# Patient Record
Sex: Female | Born: 1955 | Race: White | Hispanic: No | Smoking: Never smoker
Health system: Southern US, Community
[De-identification: ages and names within clinical notes are randomized; demographics above are authoritative.]

## PROBLEM LIST (undated history)

## (undated) DIAGNOSIS — F410 Panic disorder [episodic paroxysmal anxiety] without agoraphobia: Secondary | ICD-10-CM

## (undated) DIAGNOSIS — F4000 Agoraphobia, unspecified: Secondary | ICD-10-CM

## (undated) DIAGNOSIS — J189 Pneumonia, unspecified organism: Secondary | ICD-10-CM

## (undated) DIAGNOSIS — M1990 Unspecified osteoarthritis, unspecified site: Secondary | ICD-10-CM

## (undated) DIAGNOSIS — F319 Bipolar disorder, unspecified: Secondary | ICD-10-CM

## (undated) DIAGNOSIS — F32A Depression, unspecified: Secondary | ICD-10-CM

## (undated) DIAGNOSIS — S27309A Unspecified injury of lung, unspecified, initial encounter: Secondary | ICD-10-CM

## (undated) DIAGNOSIS — C801 Malignant (primary) neoplasm, unspecified: Secondary | ICD-10-CM

## (undated) DIAGNOSIS — F329 Major depressive disorder, single episode, unspecified: Secondary | ICD-10-CM

## (undated) DIAGNOSIS — F4310 Post-traumatic stress disorder, unspecified: Secondary | ICD-10-CM

## (undated) HISTORY — PX: COLONOSCOPY: SHX174

## (undated) HISTORY — PX: TOE SURGERY: SHX1073

## (undated) HISTORY — PX: REPLACEMENT TOTAL KNEE: SUR1224

## (undated) HISTORY — PX: ABDOMINAL HYSTERECTOMY: SHX81

## (undated) HISTORY — DX: Unspecified osteoarthritis, unspecified site: M19.90

---

## 1997-10-04 ENCOUNTER — Emergency Department (HOSPITAL_COMMUNITY): Admission: EM | Admit: 1997-10-04 | Discharge: 1997-10-04 | Payer: Self-pay | Admitting: Emergency Medicine

## 1997-10-06 ENCOUNTER — Emergency Department (HOSPITAL_COMMUNITY): Admission: EM | Admit: 1997-10-06 | Discharge: 1997-10-06 | Payer: Self-pay | Admitting: Emergency Medicine

## 1998-03-01 ENCOUNTER — Emergency Department (HOSPITAL_COMMUNITY): Admission: EM | Admit: 1998-03-01 | Discharge: 1998-03-01 | Payer: Self-pay | Admitting: Emergency Medicine

## 1998-04-15 ENCOUNTER — Ambulatory Visit (HOSPITAL_COMMUNITY): Admission: RE | Admit: 1998-04-15 | Discharge: 1998-04-15 | Payer: Self-pay | Admitting: Family Medicine

## 1998-04-15 ENCOUNTER — Encounter: Payer: Self-pay | Admitting: Family Medicine

## 1998-05-30 ENCOUNTER — Other Ambulatory Visit: Admission: RE | Admit: 1998-05-30 | Discharge: 1998-05-30 | Payer: Self-pay | Admitting: *Deleted

## 1999-11-10 ENCOUNTER — Other Ambulatory Visit: Admission: RE | Admit: 1999-11-10 | Discharge: 1999-11-10 | Payer: Self-pay | Admitting: *Deleted

## 1999-11-18 ENCOUNTER — Encounter: Payer: Self-pay | Admitting: *Deleted

## 1999-11-18 ENCOUNTER — Encounter: Admission: RE | Admit: 1999-11-18 | Discharge: 1999-11-18 | Payer: Self-pay | Admitting: *Deleted

## 2009-04-21 ENCOUNTER — Ambulatory Visit: Payer: Self-pay

## 2009-06-03 ENCOUNTER — Ambulatory Visit: Payer: Self-pay

## 2013-08-03 ENCOUNTER — Ambulatory Visit: Payer: Self-pay | Admitting: Gastroenterology

## 2014-10-21 ENCOUNTER — Other Ambulatory Visit: Payer: Self-pay

## 2014-10-21 ENCOUNTER — Emergency Department
Admission: EM | Admit: 2014-10-21 | Discharge: 2014-10-21 | Disposition: A | Discharge status: Home / Self Care | Payer: PPO | Attending: Emergency Medicine | Admitting: Emergency Medicine

## 2014-10-21 ENCOUNTER — Emergency Department: Payer: PPO

## 2014-10-21 ENCOUNTER — Encounter: Payer: Self-pay | Admitting: Emergency Medicine

## 2014-10-21 DIAGNOSIS — R200 Anesthesia of skin: Secondary | ICD-10-CM | POA: Diagnosis present

## 2014-10-21 DIAGNOSIS — R42 Dizziness and giddiness: Secondary | ICD-10-CM | POA: Diagnosis not present

## 2014-10-21 DIAGNOSIS — M542 Cervicalgia: Secondary | ICD-10-CM | POA: Insufficient documentation

## 2014-10-21 DIAGNOSIS — R51 Headache: Secondary | ICD-10-CM | POA: Insufficient documentation

## 2014-10-21 DIAGNOSIS — R0602 Shortness of breath: Secondary | ICD-10-CM | POA: Insufficient documentation

## 2014-10-21 HISTORY — DX: Post-traumatic stress disorder, unspecified: F43.10

## 2014-10-21 HISTORY — DX: Depression, unspecified: F32.A

## 2014-10-21 HISTORY — DX: Agoraphobia, unspecified: F40.00

## 2014-10-21 HISTORY — DX: Bipolar disorder, unspecified: F31.9

## 2014-10-21 HISTORY — DX: Panic disorder (episodic paroxysmal anxiety): F41.0

## 2014-10-21 HISTORY — DX: Major depressive disorder, single episode, unspecified: F32.9

## 2014-10-21 LAB — CBC
HCT: 40.9 % (ref 35.0–47.0)
Hemoglobin: 13.5 g/dL (ref 12.0–16.0)
MCH: 30.3 pg (ref 26.0–34.0)
MCHC: 33 g/dL (ref 32.0–36.0)
MCV: 91.9 fL (ref 80.0–100.0)
Platelets: 275 10*3/uL (ref 150–440)
RBC: 4.45 MIL/uL (ref 3.80–5.20)
RDW: 13.6 % (ref 11.5–14.5)
WBC: 7.6 10*3/uL (ref 3.6–11.0)

## 2014-10-21 LAB — BASIC METABOLIC PANEL
Anion gap: 9 (ref 5–15)
BUN: 16 mg/dL (ref 6–20)
CO2: 26 mmol/L (ref 22–32)
Calcium: 9.2 mg/dL (ref 8.9–10.3)
Chloride: 101 mmol/L (ref 101–111)
Creatinine, Ser: 0.98 mg/dL (ref 0.44–1.00)
GFR calc Af Amer: >60 mL/min (ref >60)
GFR calc non Af Amer: >60 mL/min (ref >60)
Glucose, Bld: 90 mg/dL (ref 65–99)
Potassium: 4.2 mmol/L (ref 3.5–5.1)
Sodium: 136 mmol/L (ref 135–145)

## 2014-10-21 NOTE — Discharge Instructions (Signed)

## 2014-10-21 NOTE — ED Notes (Signed)
Pt presents with right arm numbness, shortness of breath and right side neck pain started last night.

## 2014-10-21 NOTE — ED Provider Notes (Signed)
[facility] Emergency Department Provider Note     Time seen: ----------------------------------------- 4:17 PM on 10/21/2014 -----------------------------------------    I have reviewed the triage vital signs and the nursing notes.   HISTORY  Chief Complaint Numbness    HPI Mindy Caldwell is a 59 y.o. female who presents ER for right arm numbness, shortness of breath and right-sided neck pain that started last night.Patient states she has history of panic attacks, but it didn't feel like a panic attack. She states where she is working its been getting very hot, she felt flushed all over, a little dizzy and had somewhat of a headache. Patient was concerned that she may be having a stroke. Doesn't history of anxiety and bipolar depression, PTSD and Agoraphobia   Past Medical History  Diagnosis Date  . Bipolar 1 disorder   . Depression   . Panic attacks   . PTSD (post-traumatic stress disorder)   . Agoraphobia     There are no active problems to display for this patient.   Past Surgical History  Procedure Laterality Date  . Abdominal hysterectomy    . Toe surgery      Allergies Mobic  Social History History  Substance Use Topics  . Smoking status: Never Smoker   . Smokeless tobacco: Not on file  . Alcohol Use: No    Review of Systems Constitutional: Negative for fever. Eyes: Negative for visual changes. ENT: Negative for sore throat. Cardiovascular: Negative for chest pain. Respiratory: Negative for shortness of breath. Gastrointestinal: Negative for abdominal pain, vomiting and diarrhea. Genitourinary: Negative for dysuria. Musculoskeletal: Negative for back pain. Skin: Negative for rash. Neurological:  Positive for headache and dizziness, negative for weakness or numbness.  10-point ROS otherwise negative.  ____________________________________________   PHYSICAL EXAM:  VITAL SIGNS: ED Triage Vitals  Enc Vitals Group   BP 10/21/14 1339 105/82 mmHg     Pulse Rate 10/21/14 1339 72     Resp 10/21/14 1339 16     Temp 10/21/14 1339 98.2 F (36.8 C)     Temp Source 10/21/14 1339 Oral     SpO2 10/21/14 1339 97 %     Weight 10/21/14 1339 208 lb (94.348 kg)     Height 10/21/14 1339 5\' 11"  (1.803 m)     Head Cir --      Peak Flow --      Pain Score 10/21/14 1345 7     Pain Loc --      Pain Edu? --      Excl. in [place]? --     Constitutional: Alert and oriented. anxious, no acute distress : Conjunctivae are normal. PERRL. Normal extraocular movements. ENT   Head: Normocephalic and atraumatic.   Nose: No congestion/rhinnorhea.   Mouth/Throat: Mucous membranes are moist.   Neck: No stridor. Cardiovascular: Normal rate, regular rhythm. Normal and symmetric distal pulses are present in all extremities. No murmurs, rubs, or gallops. Respiratory: Normal respiratory effort without tachypnea nor retractions. Breath sounds are clear and equal bilaterally. No wheezes/rales/rhonchi. Gastrointestinal: Soft and nontender. No distention. No abdominal bruits. There is no CVA tenderness. Musculoskeletal: Nontender with normal range of motion in all extremities. No joint effusions.  No lower extremity tenderness nor edema. Neurologic:  Normal speech and language. No gross focal neurologic deficits are appreciated. Speech is normal. No gait instability. Skin:  Skin is warm, dry and intact. No rash noted. Psychiatric: Mood and affect are normal. Patient exhibits appropriate insight and judgment. ____________________________________________  EKG:  Interpreted by me. Normal sinus rhythm with short PR, PACs, rate is 68 bpm. Normal axis. No evidence of acute infarction.  ____________________________________________  ED COURSE:  Pertinent labs & imaging Patient will need basic lab work, neurologicresults that were available during my care of the patient were reviewed by me and considered in my medical decision making  (see chart for details). Patient need basic labs, reevaluation ____________________________________________    LABS (pertinent positives/negatives)  Labs Reviewed  BASIC METABOLIC PANEL  CBC    RADIOLOGY Images were viewed by me  CT head is unremarkable  ____________________________________________  FINAL ASSESSMENT AND PLAN  Dizziness  Plan: Patient with labs and imaging as dictated above. Symptoms are likely a combination of multiple issues including stress, poor sleep, poor diet and anxiety. Patient looks well, normal examination and normal workup here. She stable for outpatient follow-up with her doctor.   Earleen Newport, MD   Earleen Newport, MD 10/21/14 [phone]

## 2015-02-24 ENCOUNTER — Other Ambulatory Visit: Payer: Self-pay | Admitting: Unknown Physician Specialty

## 2015-02-24 DIAGNOSIS — M546 Pain in thoracic spine: Secondary | ICD-10-CM

## 2015-03-17 ENCOUNTER — Ambulatory Visit
Admission: RE | Admit: 2015-03-17 | Discharge: 2015-03-17 | Disposition: A | Discharge status: Home / Self Care | Payer: PPO | Source: Ambulatory Visit | Attending: Unknown Physician Specialty | Admitting: Unknown Physician Specialty

## 2015-03-17 DIAGNOSIS — M546 Pain in thoracic spine: Secondary | ICD-10-CM

## 2015-03-17 DIAGNOSIS — M47814 Spondylosis without myelopathy or radiculopathy, thoracic region: Secondary | ICD-10-CM | POA: Diagnosis not present

## 2015-03-31 DIAGNOSIS — M9902 Segmental and somatic dysfunction of thoracic region: Secondary | ICD-10-CM | POA: Diagnosis not present

## 2015-03-31 DIAGNOSIS — M9901 Segmental and somatic dysfunction of cervical region: Secondary | ICD-10-CM | POA: Diagnosis not present

## 2015-03-31 DIAGNOSIS — M50322 Other cervical disc degeneration at C5-C6 level: Secondary | ICD-10-CM | POA: Diagnosis not present

## 2015-03-31 DIAGNOSIS — M542 Cervicalgia: Secondary | ICD-10-CM | POA: Diagnosis not present

## 2015-04-02 DIAGNOSIS — M50322 Other cervical disc degeneration at C5-C6 level: Secondary | ICD-10-CM | POA: Diagnosis not present

## 2015-04-02 DIAGNOSIS — M9902 Segmental and somatic dysfunction of thoracic region: Secondary | ICD-10-CM | POA: Diagnosis not present

## 2015-04-02 DIAGNOSIS — M546 Pain in thoracic spine: Secondary | ICD-10-CM | POA: Diagnosis not present

## 2015-04-02 DIAGNOSIS — M5412 Radiculopathy, cervical region: Secondary | ICD-10-CM | POA: Diagnosis not present

## 2015-04-02 DIAGNOSIS — M9901 Segmental and somatic dysfunction of cervical region: Secondary | ICD-10-CM | POA: Diagnosis not present

## 2015-04-02 DIAGNOSIS — M542 Cervicalgia: Secondary | ICD-10-CM | POA: Diagnosis not present

## 2015-04-04 DIAGNOSIS — M9902 Segmental and somatic dysfunction of thoracic region: Secondary | ICD-10-CM | POA: Diagnosis not present

## 2015-04-04 DIAGNOSIS — M542 Cervicalgia: Secondary | ICD-10-CM | POA: Diagnosis not present

## 2015-04-04 DIAGNOSIS — M9901 Segmental and somatic dysfunction of cervical region: Secondary | ICD-10-CM | POA: Diagnosis not present

## 2015-04-04 DIAGNOSIS — M50322 Other cervical disc degeneration at C5-C6 level: Secondary | ICD-10-CM | POA: Diagnosis not present

## 2015-04-07 DIAGNOSIS — M9901 Segmental and somatic dysfunction of cervical region: Secondary | ICD-10-CM | POA: Diagnosis not present

## 2015-04-07 DIAGNOSIS — M542 Cervicalgia: Secondary | ICD-10-CM | POA: Diagnosis not present

## 2015-04-07 DIAGNOSIS — M50322 Other cervical disc degeneration at C5-C6 level: Secondary | ICD-10-CM | POA: Diagnosis not present

## 2015-04-07 DIAGNOSIS — M9902 Segmental and somatic dysfunction of thoracic region: Secondary | ICD-10-CM | POA: Diagnosis not present

## 2015-04-09 DIAGNOSIS — M9902 Segmental and somatic dysfunction of thoracic region: Secondary | ICD-10-CM | POA: Diagnosis not present

## 2015-04-09 DIAGNOSIS — Z0000 Encounter for general adult medical examination without abnormal findings: Secondary | ICD-10-CM | POA: Diagnosis not present

## 2015-04-09 DIAGNOSIS — Z1211 Encounter for screening for malignant neoplasm of colon: Secondary | ICD-10-CM | POA: Diagnosis not present

## 2015-04-09 DIAGNOSIS — M50322 Other cervical disc degeneration at C5-C6 level: Secondary | ICD-10-CM | POA: Diagnosis not present

## 2015-04-09 DIAGNOSIS — Z1272 Encounter for screening for malignant neoplasm of vagina: Secondary | ICD-10-CM | POA: Diagnosis not present

## 2015-04-09 DIAGNOSIS — Z1239 Encounter for other screening for malignant neoplasm of breast: Secondary | ICD-10-CM | POA: Diagnosis not present

## 2015-04-09 DIAGNOSIS — M9901 Segmental and somatic dysfunction of cervical region: Secondary | ICD-10-CM | POA: Diagnosis not present

## 2015-04-09 DIAGNOSIS — M542 Cervicalgia: Secondary | ICD-10-CM | POA: Diagnosis not present

## 2015-04-11 DIAGNOSIS — M542 Cervicalgia: Secondary | ICD-10-CM | POA: Diagnosis not present

## 2015-04-11 DIAGNOSIS — M9901 Segmental and somatic dysfunction of cervical region: Secondary | ICD-10-CM | POA: Diagnosis not present

## 2015-04-11 DIAGNOSIS — M9902 Segmental and somatic dysfunction of thoracic region: Secondary | ICD-10-CM | POA: Diagnosis not present

## 2015-04-11 DIAGNOSIS — M50322 Other cervical disc degeneration at C5-C6 level: Secondary | ICD-10-CM | POA: Diagnosis not present

## 2015-04-14 DIAGNOSIS — M542 Cervicalgia: Secondary | ICD-10-CM | POA: Diagnosis not present

## 2015-04-14 DIAGNOSIS — M9902 Segmental and somatic dysfunction of thoracic region: Secondary | ICD-10-CM | POA: Diagnosis not present

## 2015-04-14 DIAGNOSIS — M50322 Other cervical disc degeneration at C5-C6 level: Secondary | ICD-10-CM | POA: Diagnosis not present

## 2015-04-14 DIAGNOSIS — M9901 Segmental and somatic dysfunction of cervical region: Secondary | ICD-10-CM | POA: Diagnosis not present

## 2015-04-15 DIAGNOSIS — Z1231 Encounter for screening mammogram for malignant neoplasm of breast: Secondary | ICD-10-CM | POA: Diagnosis not present

## 2015-04-15 DIAGNOSIS — Z0000 Encounter for general adult medical examination without abnormal findings: Secondary | ICD-10-CM | POA: Diagnosis not present

## 2015-04-17 DIAGNOSIS — M9902 Segmental and somatic dysfunction of thoracic region: Secondary | ICD-10-CM | POA: Diagnosis not present

## 2015-04-17 DIAGNOSIS — M9901 Segmental and somatic dysfunction of cervical region: Secondary | ICD-10-CM | POA: Diagnosis not present

## 2015-04-17 DIAGNOSIS — M50322 Other cervical disc degeneration at C5-C6 level: Secondary | ICD-10-CM | POA: Diagnosis not present

## 2015-04-17 DIAGNOSIS — M542 Cervicalgia: Secondary | ICD-10-CM | POA: Diagnosis not present

## 2015-04-23 DIAGNOSIS — M9901 Segmental and somatic dysfunction of cervical region: Secondary | ICD-10-CM | POA: Diagnosis not present

## 2015-04-23 DIAGNOSIS — R799 Abnormal finding of blood chemistry, unspecified: Secondary | ICD-10-CM | POA: Diagnosis not present

## 2015-04-23 DIAGNOSIS — M9902 Segmental and somatic dysfunction of thoracic region: Secondary | ICD-10-CM | POA: Diagnosis not present

## 2015-04-23 DIAGNOSIS — M542 Cervicalgia: Secondary | ICD-10-CM | POA: Diagnosis not present

## 2015-04-23 DIAGNOSIS — M50322 Other cervical disc degeneration at C5-C6 level: Secondary | ICD-10-CM | POA: Diagnosis not present

## 2015-04-30 DIAGNOSIS — M542 Cervicalgia: Secondary | ICD-10-CM | POA: Diagnosis not present

## 2015-04-30 DIAGNOSIS — M9902 Segmental and somatic dysfunction of thoracic region: Secondary | ICD-10-CM | POA: Diagnosis not present

## 2015-04-30 DIAGNOSIS — M50322 Other cervical disc degeneration at C5-C6 level: Secondary | ICD-10-CM | POA: Diagnosis not present

## 2015-04-30 DIAGNOSIS — M9901 Segmental and somatic dysfunction of cervical region: Secondary | ICD-10-CM | POA: Diagnosis not present

## 2015-04-30 DIAGNOSIS — N289 Disorder of kidney and ureter, unspecified: Secondary | ICD-10-CM | POA: Diagnosis not present

## 2015-05-09 DIAGNOSIS — M1711 Unilateral primary osteoarthritis, right knee: Secondary | ICD-10-CM | POA: Diagnosis not present

## 2015-05-09 DIAGNOSIS — M7551 Bursitis of right shoulder: Secondary | ICD-10-CM | POA: Diagnosis not present

## 2015-05-09 DIAGNOSIS — M5412 Radiculopathy, cervical region: Secondary | ICD-10-CM | POA: Diagnosis not present

## 2015-05-22 DIAGNOSIS — M50322 Other cervical disc degeneration at C5-C6 level: Secondary | ICD-10-CM | POA: Diagnosis not present

## 2015-05-22 DIAGNOSIS — M9901 Segmental and somatic dysfunction of cervical region: Secondary | ICD-10-CM | POA: Diagnosis not present

## 2015-05-22 DIAGNOSIS — M542 Cervicalgia: Secondary | ICD-10-CM | POA: Diagnosis not present

## 2015-05-22 DIAGNOSIS — M9902 Segmental and somatic dysfunction of thoracic region: Secondary | ICD-10-CM | POA: Diagnosis not present

## 2015-05-28 DIAGNOSIS — Z1159 Encounter for screening for other viral diseases: Secondary | ICD-10-CM | POA: Diagnosis not present

## 2015-05-28 DIAGNOSIS — N289 Disorder of kidney and ureter, unspecified: Secondary | ICD-10-CM | POA: Diagnosis not present

## 2015-06-04 DIAGNOSIS — F4001 Agoraphobia with panic disorder: Secondary | ICD-10-CM | POA: Diagnosis not present

## 2015-06-04 DIAGNOSIS — F3181 Bipolar II disorder: Secondary | ICD-10-CM | POA: Diagnosis not present

## 2015-06-04 DIAGNOSIS — M50322 Other cervical disc degeneration at C5-C6 level: Secondary | ICD-10-CM | POA: Diagnosis not present

## 2015-06-04 DIAGNOSIS — M25511 Pain in right shoulder: Secondary | ICD-10-CM | POA: Diagnosis not present

## 2015-06-04 DIAGNOSIS — M9902 Segmental and somatic dysfunction of thoracic region: Secondary | ICD-10-CM | POA: Diagnosis not present

## 2015-06-04 DIAGNOSIS — M9901 Segmental and somatic dysfunction of cervical region: Secondary | ICD-10-CM | POA: Diagnosis not present

## 2015-06-05 DIAGNOSIS — Z79899 Other long term (current) drug therapy: Secondary | ICD-10-CM | POA: Diagnosis not present

## 2015-06-05 DIAGNOSIS — R768 Other specified abnormal immunological findings in serum: Secondary | ICD-10-CM | POA: Diagnosis not present

## 2015-06-23 DIAGNOSIS — F4001 Agoraphobia with panic disorder: Secondary | ICD-10-CM | POA: Diagnosis not present

## 2015-06-30 DIAGNOSIS — M50322 Other cervical disc degeneration at C5-C6 level: Secondary | ICD-10-CM | POA: Diagnosis not present

## 2015-06-30 DIAGNOSIS — M9902 Segmental and somatic dysfunction of thoracic region: Secondary | ICD-10-CM | POA: Diagnosis not present

## 2015-06-30 DIAGNOSIS — M25511 Pain in right shoulder: Secondary | ICD-10-CM | POA: Diagnosis not present

## 2015-06-30 DIAGNOSIS — M9901 Segmental and somatic dysfunction of cervical region: Secondary | ICD-10-CM | POA: Diagnosis not present

## 2015-07-08 DIAGNOSIS — F3181 Bipolar II disorder: Secondary | ICD-10-CM | POA: Diagnosis not present

## 2015-07-08 DIAGNOSIS — F4001 Agoraphobia with panic disorder: Secondary | ICD-10-CM | POA: Diagnosis not present

## 2015-07-17 DIAGNOSIS — F4001 Agoraphobia with panic disorder: Secondary | ICD-10-CM | POA: Diagnosis not present

## 2015-07-17 DIAGNOSIS — F3181 Bipolar II disorder: Secondary | ICD-10-CM | POA: Diagnosis not present

## 2015-07-23 DIAGNOSIS — F3181 Bipolar II disorder: Secondary | ICD-10-CM | POA: Diagnosis not present

## 2015-07-23 DIAGNOSIS — F4001 Agoraphobia with panic disorder: Secondary | ICD-10-CM | POA: Diagnosis not present

## 2015-08-06 DIAGNOSIS — F3181 Bipolar II disorder: Secondary | ICD-10-CM | POA: Diagnosis not present

## 2015-08-06 DIAGNOSIS — F4001 Agoraphobia with panic disorder: Secondary | ICD-10-CM | POA: Diagnosis not present

## 2015-08-13 DIAGNOSIS — F4001 Agoraphobia with panic disorder: Secondary | ICD-10-CM | POA: Diagnosis not present

## 2015-08-13 DIAGNOSIS — M50322 Other cervical disc degeneration at C5-C6 level: Secondary | ICD-10-CM | POA: Diagnosis not present

## 2015-08-13 DIAGNOSIS — M9902 Segmental and somatic dysfunction of thoracic region: Secondary | ICD-10-CM | POA: Diagnosis not present

## 2015-08-13 DIAGNOSIS — M9901 Segmental and somatic dysfunction of cervical region: Secondary | ICD-10-CM | POA: Diagnosis not present

## 2015-08-13 DIAGNOSIS — M25511 Pain in right shoulder: Secondary | ICD-10-CM | POA: Diagnosis not present

## 2015-08-13 DIAGNOSIS — F3181 Bipolar II disorder: Secondary | ICD-10-CM | POA: Diagnosis not present

## 2015-08-19 DIAGNOSIS — F4001 Agoraphobia with panic disorder: Secondary | ICD-10-CM | POA: Diagnosis not present

## 2015-08-19 DIAGNOSIS — M50322 Other cervical disc degeneration at C5-C6 level: Secondary | ICD-10-CM | POA: Diagnosis not present

## 2015-08-19 DIAGNOSIS — F3181 Bipolar II disorder: Secondary | ICD-10-CM | POA: Diagnosis not present

## 2015-08-19 DIAGNOSIS — M9902 Segmental and somatic dysfunction of thoracic region: Secondary | ICD-10-CM | POA: Diagnosis not present

## 2015-08-19 DIAGNOSIS — M25511 Pain in right shoulder: Secondary | ICD-10-CM | POA: Diagnosis not present

## 2015-08-19 DIAGNOSIS — M9901 Segmental and somatic dysfunction of cervical region: Secondary | ICD-10-CM | POA: Diagnosis not present

## 2015-10-02 DIAGNOSIS — F4001 Agoraphobia with panic disorder: Secondary | ICD-10-CM | POA: Diagnosis not present

## 2015-10-02 DIAGNOSIS — F3181 Bipolar II disorder: Secondary | ICD-10-CM | POA: Diagnosis not present

## 2015-10-09 DIAGNOSIS — F3181 Bipolar II disorder: Secondary | ICD-10-CM | POA: Diagnosis not present

## 2015-10-09 DIAGNOSIS — F4001 Agoraphobia with panic disorder: Secondary | ICD-10-CM | POA: Diagnosis not present

## 2015-11-05 DIAGNOSIS — M50322 Other cervical disc degeneration at C5-C6 level: Secondary | ICD-10-CM | POA: Diagnosis not present

## 2015-11-05 DIAGNOSIS — M9903 Segmental and somatic dysfunction of lumbar region: Secondary | ICD-10-CM | POA: Diagnosis not present

## 2015-11-05 DIAGNOSIS — M5136 Other intervertebral disc degeneration, lumbar region: Secondary | ICD-10-CM | POA: Diagnosis not present

## 2015-11-05 DIAGNOSIS — M9901 Segmental and somatic dysfunction of cervical region: Secondary | ICD-10-CM | POA: Diagnosis not present

## 2015-11-10 DIAGNOSIS — M9901 Segmental and somatic dysfunction of cervical region: Secondary | ICD-10-CM | POA: Diagnosis not present

## 2015-11-10 DIAGNOSIS — M50322 Other cervical disc degeneration at C5-C6 level: Secondary | ICD-10-CM | POA: Diagnosis not present

## 2015-11-10 DIAGNOSIS — M9903 Segmental and somatic dysfunction of lumbar region: Secondary | ICD-10-CM | POA: Diagnosis not present

## 2015-11-10 DIAGNOSIS — M5136 Other intervertebral disc degeneration, lumbar region: Secondary | ICD-10-CM | POA: Diagnosis not present

## 2015-11-12 DIAGNOSIS — M50322 Other cervical disc degeneration at C5-C6 level: Secondary | ICD-10-CM | POA: Diagnosis not present

## 2015-11-12 DIAGNOSIS — F4001 Agoraphobia with panic disorder: Secondary | ICD-10-CM | POA: Diagnosis not present

## 2015-11-12 DIAGNOSIS — M9901 Segmental and somatic dysfunction of cervical region: Secondary | ICD-10-CM | POA: Diagnosis not present

## 2015-11-12 DIAGNOSIS — M9903 Segmental and somatic dysfunction of lumbar region: Secondary | ICD-10-CM | POA: Diagnosis not present

## 2015-11-12 DIAGNOSIS — M5136 Other intervertebral disc degeneration, lumbar region: Secondary | ICD-10-CM | POA: Diagnosis not present

## 2015-11-12 DIAGNOSIS — F3181 Bipolar II disorder: Secondary | ICD-10-CM | POA: Diagnosis not present

## 2015-11-24 DIAGNOSIS — M25562 Pain in left knee: Secondary | ICD-10-CM | POA: Diagnosis not present

## 2015-11-24 DIAGNOSIS — M170 Bilateral primary osteoarthritis of knee: Secondary | ICD-10-CM | POA: Diagnosis not present

## 2015-11-24 DIAGNOSIS — M5412 Radiculopathy, cervical region: Secondary | ICD-10-CM | POA: Diagnosis not present

## 2015-12-05 DIAGNOSIS — M5136 Other intervertebral disc degeneration, lumbar region: Secondary | ICD-10-CM | POA: Diagnosis not present

## 2015-12-05 DIAGNOSIS — M9901 Segmental and somatic dysfunction of cervical region: Secondary | ICD-10-CM | POA: Diagnosis not present

## 2015-12-05 DIAGNOSIS — M9903 Segmental and somatic dysfunction of lumbar region: Secondary | ICD-10-CM | POA: Diagnosis not present

## 2015-12-05 DIAGNOSIS — M50322 Other cervical disc degeneration at C5-C6 level: Secondary | ICD-10-CM | POA: Diagnosis not present

## 2016-02-06 DIAGNOSIS — M9903 Segmental and somatic dysfunction of lumbar region: Secondary | ICD-10-CM | POA: Diagnosis not present

## 2016-02-06 DIAGNOSIS — F3181 Bipolar II disorder: Secondary | ICD-10-CM | POA: Diagnosis not present

## 2016-02-06 DIAGNOSIS — M50322 Other cervical disc degeneration at C5-C6 level: Secondary | ICD-10-CM | POA: Diagnosis not present

## 2016-02-06 DIAGNOSIS — F4001 Agoraphobia with panic disorder: Secondary | ICD-10-CM | POA: Diagnosis not present

## 2016-02-06 DIAGNOSIS — M5136 Other intervertebral disc degeneration, lumbar region: Secondary | ICD-10-CM | POA: Diagnosis not present

## 2016-02-06 DIAGNOSIS — M9901 Segmental and somatic dysfunction of cervical region: Secondary | ICD-10-CM | POA: Diagnosis not present

## 2016-02-17 DIAGNOSIS — M50322 Other cervical disc degeneration at C5-C6 level: Secondary | ICD-10-CM | POA: Diagnosis not present

## 2016-02-17 DIAGNOSIS — M9903 Segmental and somatic dysfunction of lumbar region: Secondary | ICD-10-CM | POA: Diagnosis not present

## 2016-02-17 DIAGNOSIS — M5136 Other intervertebral disc degeneration, lumbar region: Secondary | ICD-10-CM | POA: Diagnosis not present

## 2016-02-17 DIAGNOSIS — M9901 Segmental and somatic dysfunction of cervical region: Secondary | ICD-10-CM | POA: Diagnosis not present

## 2016-02-18 DIAGNOSIS — M9903 Segmental and somatic dysfunction of lumbar region: Secondary | ICD-10-CM | POA: Diagnosis not present

## 2016-02-18 DIAGNOSIS — M50322 Other cervical disc degeneration at C5-C6 level: Secondary | ICD-10-CM | POA: Diagnosis not present

## 2016-02-18 DIAGNOSIS — M5136 Other intervertebral disc degeneration, lumbar region: Secondary | ICD-10-CM | POA: Diagnosis not present

## 2016-02-18 DIAGNOSIS — M9901 Segmental and somatic dysfunction of cervical region: Secondary | ICD-10-CM | POA: Diagnosis not present

## 2016-02-23 DIAGNOSIS — M50322 Other cervical disc degeneration at C5-C6 level: Secondary | ICD-10-CM | POA: Diagnosis not present

## 2016-02-23 DIAGNOSIS — M9901 Segmental and somatic dysfunction of cervical region: Secondary | ICD-10-CM | POA: Diagnosis not present

## 2016-02-23 DIAGNOSIS — M5136 Other intervertebral disc degeneration, lumbar region: Secondary | ICD-10-CM | POA: Diagnosis not present

## 2016-02-23 DIAGNOSIS — M9903 Segmental and somatic dysfunction of lumbar region: Secondary | ICD-10-CM | POA: Diagnosis not present

## 2016-02-24 DIAGNOSIS — M50322 Other cervical disc degeneration at C5-C6 level: Secondary | ICD-10-CM | POA: Diagnosis not present

## 2016-02-24 DIAGNOSIS — M9903 Segmental and somatic dysfunction of lumbar region: Secondary | ICD-10-CM | POA: Diagnosis not present

## 2016-02-24 DIAGNOSIS — M5136 Other intervertebral disc degeneration, lumbar region: Secondary | ICD-10-CM | POA: Diagnosis not present

## 2016-02-24 DIAGNOSIS — M9901 Segmental and somatic dysfunction of cervical region: Secondary | ICD-10-CM | POA: Diagnosis not present

## 2016-02-25 DIAGNOSIS — M50322 Other cervical disc degeneration at C5-C6 level: Secondary | ICD-10-CM | POA: Diagnosis not present

## 2016-02-25 DIAGNOSIS — M5136 Other intervertebral disc degeneration, lumbar region: Secondary | ICD-10-CM | POA: Diagnosis not present

## 2016-02-25 DIAGNOSIS — M9903 Segmental and somatic dysfunction of lumbar region: Secondary | ICD-10-CM | POA: Diagnosis not present

## 2016-02-25 DIAGNOSIS — M9901 Segmental and somatic dysfunction of cervical region: Secondary | ICD-10-CM | POA: Diagnosis not present

## 2016-03-02 DIAGNOSIS — M5136 Other intervertebral disc degeneration, lumbar region: Secondary | ICD-10-CM | POA: Diagnosis not present

## 2016-03-02 DIAGNOSIS — M9901 Segmental and somatic dysfunction of cervical region: Secondary | ICD-10-CM | POA: Diagnosis not present

## 2016-03-02 DIAGNOSIS — M9903 Segmental and somatic dysfunction of lumbar region: Secondary | ICD-10-CM | POA: Diagnosis not present

## 2016-03-02 DIAGNOSIS — M50322 Other cervical disc degeneration at C5-C6 level: Secondary | ICD-10-CM | POA: Diagnosis not present

## 2016-03-04 DIAGNOSIS — S43401A Unspecified sprain of right shoulder joint, initial encounter: Secondary | ICD-10-CM | POA: Diagnosis not present

## 2016-03-04 DIAGNOSIS — M7551 Bursitis of right shoulder: Secondary | ICD-10-CM | POA: Diagnosis not present

## 2016-03-09 DIAGNOSIS — M9901 Segmental and somatic dysfunction of cervical region: Secondary | ICD-10-CM | POA: Diagnosis not present

## 2016-03-09 DIAGNOSIS — M50322 Other cervical disc degeneration at C5-C6 level: Secondary | ICD-10-CM | POA: Diagnosis not present

## 2016-03-09 DIAGNOSIS — M9903 Segmental and somatic dysfunction of lumbar region: Secondary | ICD-10-CM | POA: Diagnosis not present

## 2016-03-09 DIAGNOSIS — M5136 Other intervertebral disc degeneration, lumbar region: Secondary | ICD-10-CM | POA: Diagnosis not present

## 2016-04-07 DIAGNOSIS — R300 Dysuria: Secondary | ICD-10-CM | POA: Diagnosis not present

## 2016-04-07 DIAGNOSIS — N390 Urinary tract infection, site not specified: Secondary | ICD-10-CM | POA: Diagnosis not present

## 2016-04-07 DIAGNOSIS — J209 Acute bronchitis, unspecified: Secondary | ICD-10-CM | POA: Diagnosis not present

## 2016-04-07 DIAGNOSIS — R062 Wheezing: Secondary | ICD-10-CM | POA: Diagnosis not present

## 2016-04-19 DIAGNOSIS — N920 Excessive and frequent menstruation with regular cycle: Secondary | ICD-10-CM | POA: Diagnosis not present

## 2016-04-19 DIAGNOSIS — N941 Unspecified dyspareunia: Secondary | ICD-10-CM | POA: Diagnosis not present

## 2016-04-19 DIAGNOSIS — N895 Stricture and atresia of vagina: Secondary | ICD-10-CM | POA: Diagnosis not present

## 2016-05-06 DIAGNOSIS — R51 Headache: Secondary | ICD-10-CM | POA: Diagnosis not present

## 2016-05-06 DIAGNOSIS — R030 Elevated blood-pressure reading, without diagnosis of hypertension: Secondary | ICD-10-CM | POA: Diagnosis not present

## 2016-05-06 DIAGNOSIS — H538 Other visual disturbances: Secondary | ICD-10-CM | POA: Diagnosis not present

## 2016-05-07 DIAGNOSIS — F319 Bipolar disorder, unspecified: Secondary | ICD-10-CM | POA: Diagnosis not present

## 2016-05-07 DIAGNOSIS — R0602 Shortness of breath: Secondary | ICD-10-CM | POA: Diagnosis not present

## 2016-05-07 DIAGNOSIS — Z79899 Other long term (current) drug therapy: Secondary | ICD-10-CM | POA: Diagnosis not present

## 2016-05-07 DIAGNOSIS — R4781 Slurred speech: Secondary | ICD-10-CM | POA: Diagnosis not present

## 2016-05-07 DIAGNOSIS — R200 Anesthesia of skin: Secondary | ICD-10-CM | POA: Diagnosis not present

## 2016-05-07 DIAGNOSIS — M542 Cervicalgia: Secondary | ICD-10-CM | POA: Diagnosis not present

## 2016-05-07 DIAGNOSIS — R51 Headache: Secondary | ICD-10-CM | POA: Diagnosis not present

## 2016-05-07 DIAGNOSIS — R269 Unspecified abnormalities of gait and mobility: Secondary | ICD-10-CM | POA: Diagnosis not present

## 2016-05-07 DIAGNOSIS — Z7982 Long term (current) use of aspirin: Secondary | ICD-10-CM | POA: Diagnosis not present

## 2016-05-07 DIAGNOSIS — R42 Dizziness and giddiness: Secondary | ICD-10-CM | POA: Diagnosis not present

## 2016-05-07 DIAGNOSIS — F419 Anxiety disorder, unspecified: Secondary | ICD-10-CM | POA: Diagnosis not present

## 2016-05-10 DIAGNOSIS — M9901 Segmental and somatic dysfunction of cervical region: Secondary | ICD-10-CM | POA: Diagnosis not present

## 2016-05-10 DIAGNOSIS — M5031 Other cervical disc degeneration,  high cervical region: Secondary | ICD-10-CM | POA: Diagnosis not present

## 2016-05-11 DIAGNOSIS — M5031 Other cervical disc degeneration,  high cervical region: Secondary | ICD-10-CM | POA: Diagnosis not present

## 2016-05-11 DIAGNOSIS — M9901 Segmental and somatic dysfunction of cervical region: Secondary | ICD-10-CM | POA: Diagnosis not present

## 2016-06-09 DIAGNOSIS — R300 Dysuria: Secondary | ICD-10-CM | POA: Diagnosis not present

## 2016-06-09 DIAGNOSIS — J40 Bronchitis, not specified as acute or chronic: Secondary | ICD-10-CM | POA: Diagnosis not present

## 2016-06-09 DIAGNOSIS — T148XXA Other injury of unspecified body region, initial encounter: Secondary | ICD-10-CM | POA: Diagnosis not present

## 2016-06-09 DIAGNOSIS — R739 Hyperglycemia, unspecified: Secondary | ICD-10-CM | POA: Diagnosis not present

## 2016-06-09 DIAGNOSIS — E785 Hyperlipidemia, unspecified: Secondary | ICD-10-CM | POA: Diagnosis not present

## 2016-06-14 DIAGNOSIS — R739 Hyperglycemia, unspecified: Secondary | ICD-10-CM | POA: Diagnosis not present

## 2016-06-14 DIAGNOSIS — E785 Hyperlipidemia, unspecified: Secondary | ICD-10-CM | POA: Diagnosis not present

## 2016-06-14 DIAGNOSIS — R300 Dysuria: Secondary | ICD-10-CM | POA: Diagnosis not present

## 2016-06-14 DIAGNOSIS — S5011XA Contusion of right forearm, initial encounter: Secondary | ICD-10-CM | POA: Diagnosis not present

## 2016-06-22 DIAGNOSIS — T07XXXA Unspecified multiple injuries, initial encounter: Secondary | ICD-10-CM | POA: Diagnosis not present

## 2016-06-28 DIAGNOSIS — T07XXXA Unspecified multiple injuries, initial encounter: Secondary | ICD-10-CM | POA: Diagnosis not present

## 2016-06-30 DIAGNOSIS — M9903 Segmental and somatic dysfunction of lumbar region: Secondary | ICD-10-CM | POA: Diagnosis not present

## 2016-06-30 DIAGNOSIS — M9902 Segmental and somatic dysfunction of thoracic region: Secondary | ICD-10-CM | POA: Diagnosis not present

## 2016-06-30 DIAGNOSIS — M5104 Intervertebral disc disorders with myelopathy, thoracic region: Secondary | ICD-10-CM | POA: Diagnosis not present

## 2016-06-30 DIAGNOSIS — M5136 Other intervertebral disc degeneration, lumbar region: Secondary | ICD-10-CM | POA: Diagnosis not present

## 2016-07-12 ENCOUNTER — Ambulatory Visit: Payer: PPO | Admitting: Oncology

## 2016-07-19 ENCOUNTER — Inpatient Hospital Stay: Payer: PPO | Attending: Oncology | Admitting: Oncology

## 2016-07-19 ENCOUNTER — Encounter: Payer: Self-pay | Admitting: Oncology

## 2016-07-19 ENCOUNTER — Inpatient Hospital Stay: Payer: PPO

## 2016-07-19 VITALS — BP 134/84 | HR 89 | Temp 97.0°F | Resp 18 | Ht 71.06 in | Wt 190.6 lb

## 2016-07-19 DIAGNOSIS — R233 Spontaneous ecchymoses: Secondary | ICD-10-CM

## 2016-07-19 DIAGNOSIS — R5383 Other fatigue: Secondary | ICD-10-CM | POA: Diagnosis not present

## 2016-07-19 DIAGNOSIS — F319 Bipolar disorder, unspecified: Secondary | ICD-10-CM | POA: Insufficient documentation

## 2016-07-19 DIAGNOSIS — D721 Eosinophilia, unspecified: Secondary | ICD-10-CM

## 2016-07-19 DIAGNOSIS — Z808 Family history of malignant neoplasm of other organs or systems: Secondary | ICD-10-CM | POA: Insufficient documentation

## 2016-07-19 DIAGNOSIS — I10 Essential (primary) hypertension: Secondary | ICD-10-CM | POA: Insufficient documentation

## 2016-07-19 DIAGNOSIS — R531 Weakness: Secondary | ICD-10-CM | POA: Insufficient documentation

## 2016-07-19 DIAGNOSIS — R7989 Other specified abnormal findings of blood chemistry: Secondary | ICD-10-CM | POA: Diagnosis not present

## 2016-07-19 DIAGNOSIS — F4310 Post-traumatic stress disorder, unspecified: Secondary | ICD-10-CM | POA: Insufficient documentation

## 2016-07-19 DIAGNOSIS — M129 Arthropathy, unspecified: Secondary | ICD-10-CM | POA: Insufficient documentation

## 2016-07-19 DIAGNOSIS — Z801 Family history of malignant neoplasm of trachea, bronchus and lung: Secondary | ICD-10-CM | POA: Diagnosis not present

## 2016-07-19 DIAGNOSIS — Z79899 Other long term (current) drug therapy: Secondary | ICD-10-CM | POA: Diagnosis not present

## 2016-07-19 DIAGNOSIS — F4000 Agoraphobia, unspecified: Secondary | ICD-10-CM | POA: Diagnosis not present

## 2016-07-19 DIAGNOSIS — G43909 Migraine, unspecified, not intractable, without status migrainosus: Secondary | ICD-10-CM | POA: Insufficient documentation

## 2016-07-19 DIAGNOSIS — R238 Other skin changes: Secondary | ICD-10-CM

## 2016-07-19 LAB — COMPREHENSIVE METABOLIC PANEL
ALT: 21 U/L (ref 14–54)
AST: 30 U/L (ref 15–41)
Albumin: 4.8 g/dL (ref 3.5–5.0)
Alkaline Phosphatase: 66 U/L (ref 38–126)
Anion gap: 8 (ref 5–15)
BILIRUBIN TOTAL: 0.7 mg/dL (ref 0.3–1.2)
BUN: 14 mg/dL (ref 6–20)
CALCIUM: 9.7 mg/dL (ref 8.9–10.3)
CO2: 28 mmol/L (ref 22–32)
Chloride: 102 mmol/L (ref 101–111)
Creatinine, Ser: 0.89 mg/dL (ref 0.44–1.00)
GFR calc Af Amer: >60 mL/min (ref >60)
GLUCOSE: 100 mg/dL — AB (ref 65–99)
POTASSIUM: 3.8 mmol/L (ref 3.5–5.1)
Sodium: 138 mmol/L (ref 135–145)
Total Protein: 9.2 g/dL — ABNORMAL HIGH (ref 6.5–8.1)

## 2016-07-19 LAB — CBC
HCT: 45.3 % (ref 35.0–47.0)
Hemoglobin: 15.1 g/dL (ref 12.0–16.0)
MCH: 31.3 pg (ref 26.0–34.0)
MCHC: 33.3 g/dL (ref 32.0–36.0)
MCV: 93.8 fL (ref 80.0–100.0)
Platelets: 261 10*3/uL (ref 150–440)
RBC: 4.82 MIL/uL (ref 3.80–5.20)
RDW: 13.5 % (ref 11.5–14.5)
WBC: 8.3 10*3/uL (ref 3.6–11.0)

## 2016-07-19 LAB — URINALYSIS, COMPLETE (UACMP) WITH MICROSCOPIC
BILIRUBIN URINE: NEGATIVE
Glucose, UA: NEGATIVE mg/dL
HGB URINE DIPSTICK: NEGATIVE
KETONES UR: NEGATIVE mg/dL
NITRITE: NEGATIVE
PROTEIN: NEGATIVE mg/dL
Specific Gravity, Urine: 1.02 (ref 1.005–1.030)
pH: 6 (ref 5.0–8.0)

## 2016-07-19 LAB — DIFFERENTIAL
BASOS ABS: 0.1 10*3/uL (ref 0–0.1)
Basophils Relative: 1 %
EOS ABS: 0.4 10*3/uL (ref 0–0.7)
Eosinophils Relative: 5 %
LYMPHS ABS: 2.6 10*3/uL (ref 1.0–3.6)
LYMPHS PCT: 31 %
MONOS PCT: 8 %
Monocytes Absolute: 0.6 10*3/uL (ref 0.2–0.9)
Neutro Abs: 4.6 10*3/uL (ref 1.4–6.5)
Neutrophils Relative %: 55 %

## 2016-07-19 LAB — PLATELET FUNCTION ASSAY: Collagen / Epinephrine: 95 seconds (ref 0–193)

## 2016-07-19 LAB — PROTIME-INR
INR: 0.95
PROTHROMBIN TIME: 12.7 s (ref 11.4–15.2)

## 2016-07-19 LAB — APTT: aPTT: 29 seconds (ref 24–36)

## 2016-07-19 LAB — PATHOLOGIST SMEAR REVIEW

## 2016-07-19 NOTE — Progress Notes (Signed)
Here  for new pt eval  

## 2016-07-19 NOTE — Progress Notes (Signed)
Hematology/Oncology Consult note [facility] [phone] [phone]  Patient Care Team: [place] Pink, MD as PCP - General (Family Medicine)   Name of the patient: Mindy Caldwell  [id number]  1955/05/10    Reason for referral- 1. Easy bruising 2. " abnormal cbc"   Referring physician- Dr. Kary Kos  Date of visit: 07/19/16   History of presenting illness- patient is a 61 yr old female with a h/o bipolar disorder. She has been trying to come off geodon and antidepressants. She also uses herbal supplements including turmeric and boswellia. She has been referred to [place] for  1. Easy bruising- patient notes superficial ecchymosis over b/l forearms for past 1 year which comes and goes. Sometimes notes small bruises over legs just below the knee. Sometimes associated with trauma sometimes not. Prior surgeries include hysterectomy and toe surgery as well as tooth extractions without bleeding issues. No gum bleeds/ noose bleeds or bleeding in stool or urine. She does not use aspirin or blood thinners but does use advil on periodic basis. Recent cbc from 06/28/16 showed normal platelet count  2. Abnormal CBC- noted to have mild eosinophilia in the past. Recent cbc from march and April 2018 showed increased "mixed count" essentially composed of eos/monocytes and basophils with normal WBC  ECOG PS- 0     Review of systems- Review of Systems  Constitutional: Positive for malaise/fatigue. Negative for chills, fever and weight loss.  HENT: Negative for congestion, ear discharge and nosebleeds.   Eyes: Negative for blurred vision.  Respiratory: Negative for cough, hemoptysis, sputum production, shortness of breath and wheezing.   Cardiovascular: Negative for chest pain, palpitations, orthopnea and claudication.  Gastrointestinal: Negative for abdominal pain, blood in stool, constipation, diarrhea, heartburn, melena, nausea and vomiting.  Genitourinary:  Negative for dysuria, flank pain, frequency, hematuria and urgency.  Musculoskeletal: Negative for back pain, joint pain and myalgias.  Skin: Negative for rash.  Neurological: Negative for dizziness, tingling, focal weakness, seizures, weakness and headaches.  Endo/Heme/Allergies: Bruises/bleeds easily.  Psychiatric/Behavioral: Negative for depression and suicidal ideas. The patient does not have insomnia.     Allergies  Allergen Reactions  . Mobic [Meloxicam] Nausea Only  . Oxybutynin Chloride Other (See Comments)    Made symptoms worse    There are no active problems to display for this patient.    Past Medical History:  Diagnosis Date  . Agoraphobia   . Arthritis   . Bipolar 1 disorder ([place])   . Depression   . Panic attacks   . PTSD (post-traumatic stress disorder)      Past Surgical History:  Procedure Laterality Date  . ABDOMINAL HYSTERECTOMY    . TOE SURGERY      Social History   Social History  . Marital status: Divorced    Spouse name: N/A  . Number of children: N/A  . Years of education: N/A   Occupational History  . Not on file.   Social History Main Topics  . Smoking status: Never Smoker  . Smokeless tobacco: Never Used  . Alcohol use No  . Drug use: Yes    Types: "Crack" cocaine, Heroin, Hashish, Marijuana, LSD, Methamphetamines, PCP, Amphetamines, Barbituates, Mescaline     Comment: per pt in teens   . Sexual activity: Yes   Other Topics Concern  . Not on file   Social History Narrative  . No narrative on file     Family History  Problem Relation Age of Onset  . Lung cancer Mother   .  Bone cancer Father   . Cervical cancer Sister   . Diabetes Sister      Current Outpatient Prescriptions:  .  ALPRAZolam (XANAX) 1 MG tablet, Take by mouth. , Disp: , Rfl:  .  Ascorbic Acid (VITAMIN C WITH ROSE HIPS) 1000 MG tablet, Take 1,000 mg by mouth daily., Disp: , Rfl:  .  cholecalciferol (VITAMIN D) 1000 units tablet, Take 1,000 Units by  mouth daily., Disp: , Rfl:  .  cholecalciferol (VITAMIN D) 1000 units tablet, Take 1,000 Units by mouth daily., Disp: , Rfl:  .  HYDROcodone-acetaminophen (NORCO/VICODIN) 5-325 MG tablet, , Disp: , Rfl:  .  NON FORMULARY, , Disp: , Rfl:  .  NON FORMULARY, , Disp: , Rfl:  .  NON FORMULARY, , Disp: , Rfl:  .  NONFORMULARY OR COMPOUNDED ITEM, , Disp: , Rfl:  .  potassium bicarbonate (K-LYTE) 25 MEQ disintegrating tablet, Take 25 mEq by mouth 2 (two) times daily., Disp: , Rfl:  .  pyridOXINE (VITAMIN B-6) 100 MG tablet, Take 100 mg by mouth daily., Disp: , Rfl:  .  pyridOXINE (VITAMIN B-6) 100 MG tablet, Take 100 mg by mouth daily., Disp: , Rfl:  .  tiZANidine (ZANAFLEX) 4 MG tablet, Take 4 mg by mouth. , Disp: , Rfl:  .  venlafaxine XR (EFFEXOR-XR) 75 MG 24 hr capsule, Take 75 mg by mouth., Disp: , Rfl:  .  vitamin B-12 (CYANOCOBALAMIN) 1000 MCG tablet, Take by mouth daily., Disp: , Rfl:  .  Vitamin D, Ergocalciferol, (DRISDOL) 50000 units CAPS capsule, Take by mouth every 7 (seven) days., Disp: , Rfl:  .  ziprasidone (GEODON) 80 MG capsule, Take 80 mg by mouth., Disp: , Rfl:    Physical exam:  Vitals:   07/19/16 1049  BP: 134/84  Pulse: 89  Resp: 18  Temp: 97 F (36.1 C)  TempSrc: Tympanic  Weight: 190 lb 9.4 oz (86.4 kg)  Height: 5' 11.06" (1.805 m)   Physical Exam  Constitutional: She is oriented to person, place, and time and well-developed, well-nourished, and in no distress.  HENT:  Head: Normocephalic and atraumatic.  Eyes: EOM are normal. Pupils are equal, round, and reactive to light.  Neck: Normal range of motion.  Cardiovascular: Normal rate, regular rhythm and normal heart sounds.   Pulmonary/Chest: Effort normal and breath sounds normal.  Abdominal: Soft. Bowel sounds are normal.  Neurological: She is alert and oriented to person, place, and time.  Skin: Skin is warm and dry.  There are scattered areas of echymosis over b/l forearms which appear to be healing.  Scattered tattoes without stigmata of bleeding/brusing       CMP Latest Ref Rng & Units 07/19/2016  Glucose 65 - 99 mg/dL 100(H)  BUN 6 - 20 mg/dL 14  Creatinine 0.44 - 1.00 mg/dL 0.89  Sodium 135 - 145 mmol/L 138  Potassium 3.5 - 5.1 mmol/L 3.8  Chloride 101 - 111 mmol/L 102  CO2 22 - 32 mmol/L 28  Calcium 8.9 - 10.3 mg/dL 9.7  Total Protein 6.5 - 8.1 g/dL 9.2(H)  Total Bilirubin 0.3 - 1.2 mg/dL 0.7  Alkaline Phos 38 - 126 U/L 66  AST 15 - 41 U/L 30  ALT 14 - 54 U/L 21   CBC Latest Ref Rng & Units 07/19/2016  WBC 3.6 - 11.0 K/uL 8.3  Hemoglobin 12.0 - 16.0 g/dL 15.1  Hematocrit 35.0 - 47.0 % 45.3  Platelets 150 - 440 K/uL 261     Assessment and  plan- Patient is a 61 y.o. female who has been refrred for following issues:   1. Easy brusing-  No bleeding issues with prior hemostatic challenges. I do not suspect that patient has any bleeding disrder. I will however check, cbc, ptt, PT/INR, PFA and Von willebrand panel. If these tests are abnormal she may need further evaluation. I have also asked patient to stop her turmeric as it has been shown to have anti platelet properties  2. Eosinophilia- probably responsible for elevate "mixed count" on prior labs. Will repeat cbc with diff and obtain UA, CXR and CMP for further evaluation  I will see her back in 2 weeks to discuss results of her blood work   Thank you for this kind referral and the opportunity to participate in the care of this patient   Visit Diagnosis 1. Eosinophilia   2. Easy bruisability     Dr. Randa Evens, MD, MPH [facility] Pager- [phone] 07/19/2016

## 2016-07-20 LAB — VON WILLEBRAND PANEL
Coagulation Factor VIII: 172 % — ABNORMAL HIGH (ref 57–163)
Ristocetin Co-factor, Plasma: 139 % (ref 50–200)
Von Willebrand Antigen, Plasma: 152 % (ref 50–200)

## 2016-07-20 LAB — COAG STUDIES INTERP REPORT

## 2016-07-28 DIAGNOSIS — F3181 Bipolar II disorder: Secondary | ICD-10-CM | POA: Diagnosis not present

## 2016-07-28 DIAGNOSIS — F4001 Agoraphobia with panic disorder: Secondary | ICD-10-CM | POA: Diagnosis not present

## 2016-07-30 ENCOUNTER — Encounter: Payer: Self-pay | Admitting: Oncology

## 2016-08-02 ENCOUNTER — Ambulatory Visit: Payer: PPO | Admitting: Oncology

## 2016-08-03 ENCOUNTER — Inpatient Hospital Stay: Payer: PPO | Attending: Oncology | Admitting: Oncology

## 2016-08-03 ENCOUNTER — Encounter: Payer: Self-pay | Admitting: Oncology

## 2016-08-03 VITALS — BP 116/84 | HR 74 | Temp 98.1°F | Resp 18 | Ht 71.06 in | Wt 190.0 lb

## 2016-08-03 DIAGNOSIS — L988 Other specified disorders of the skin and subcutaneous tissue: Secondary | ICD-10-CM | POA: Diagnosis not present

## 2016-08-03 DIAGNOSIS — M542 Cervicalgia: Secondary | ICD-10-CM | POA: Diagnosis not present

## 2016-08-03 DIAGNOSIS — R0602 Shortness of breath: Secondary | ICD-10-CM | POA: Diagnosis not present

## 2016-08-03 DIAGNOSIS — Z801 Family history of malignant neoplasm of trachea, bronchus and lung: Secondary | ICD-10-CM | POA: Diagnosis not present

## 2016-08-03 DIAGNOSIS — F4310 Post-traumatic stress disorder, unspecified: Secondary | ICD-10-CM | POA: Diagnosis not present

## 2016-08-03 DIAGNOSIS — F4000 Agoraphobia, unspecified: Secondary | ICD-10-CM

## 2016-08-03 DIAGNOSIS — R0609 Other forms of dyspnea: Secondary | ICD-10-CM

## 2016-08-03 DIAGNOSIS — D721 Eosinophilia: Secondary | ICD-10-CM | POA: Diagnosis not present

## 2016-08-03 DIAGNOSIS — Z808 Family history of malignant neoplasm of other organs or systems: Secondary | ICD-10-CM | POA: Diagnosis not present

## 2016-08-03 DIAGNOSIS — F319 Bipolar disorder, unspecified: Secondary | ICD-10-CM | POA: Diagnosis not present

## 2016-08-03 DIAGNOSIS — R233 Spontaneous ecchymoses: Secondary | ICD-10-CM

## 2016-08-03 DIAGNOSIS — R51 Headache: Secondary | ICD-10-CM | POA: Diagnosis not present

## 2016-08-03 DIAGNOSIS — D7219 Other eosinophilia: Secondary | ICD-10-CM

## 2016-08-03 DIAGNOSIS — R238 Other skin changes: Secondary | ICD-10-CM

## 2016-08-03 NOTE — Progress Notes (Signed)
Hematology/Oncology Consult note [facility]  Telephone:([phone] [phone]  Patient Care Team: [place] Pink, MD as PCP - General (Family Medicine)   Name of the patient: Mindy Caldwell  [id number]  Aug 05, 1955   Date of visit: 08/03/16  Diagnosis- 1. Easy bruising not related to bleeding disorder  2. Mild Eosinophilia- intermittent and likely reactive  Chief complaint/ Reason for visit- discuss results of bloodwork  Heme/Onc history: patient is a 61 yr old female with a h/o bipolar disorder. She has been trying to come off geodon and antidepressants. She also uses herbal supplements including turmeric and boswellia. She has been referred to [place] for  1. Easy bruising- patient notes superficial ecchymosis over b/l forearms for past 1 year which comes and goes. Sometimes notes small bruises over legs just below the knee. Sometimes associated with trauma sometimes not. Prior surgeries include hysterectomy and toe surgery as well as tooth extractions without bleeding issues. No gum bleeds/ noose bleeds or bleeding in stool or urine. She does not use aspirin or blood thinners but does use advil on periodic basis. Recent cbc from 06/28/16 showed normal platelet count  2. Abnormal CBC- noted to have mild eosinophilia in the past. Recent cbc from march and April 2018 showed increased "mixed count" essentially composed of eos/monocytes and basophils with normal WBC  3. Blood work from 07/19/2016 was as follows: CBC showed white count of 8.3 with a normal differential and no eosinophilia. H&H is 15.1/45.3 and platelet count was 261. Review of peripheral smear showed normal RBC and platelet morphology as well as white cell morphology. Negative for schistocytes. CMP was normal except for mildly elevated total protein of 9.2. PT/PTT INR was within normal limits. Platelet function assay was normal. Normal von Willebrand panel showed mildly elevated factor VIII level  of 172  Interval history- bruising in her bilateral forearms is healed. She is now off trazodone and Geodon which she had been taking for mood disorder. She does reports headaches and neck pain as well as increase in anxiety and panic attacks after she has come off her medication. She also reports some shortness of breath probably related to her anxiety    Review of systems- Review of Systems  Constitutional: Negative for chills, fever, malaise/fatigue and weight loss.  HENT: Negative for congestion, ear discharge and nosebleeds.   Eyes: Negative for blurred vision.  Respiratory: Positive for shortness of breath. Negative for cough, hemoptysis, sputum production and wheezing.   Cardiovascular: Negative for chest pain, palpitations, orthopnea and claudication.  Gastrointestinal: Negative for abdominal pain, blood in stool, constipation, diarrhea, heartburn, melena, nausea and vomiting.  Genitourinary: Negative for dysuria, flank pain, frequency, hematuria and urgency.  Musculoskeletal: Positive for neck pain. Negative for back pain, joint pain and myalgias.  Skin: Negative for rash.  Neurological: Positive for headaches. Negative for dizziness, tingling, focal weakness, seizures and weakness.  Endo/Heme/Allergies: Does not bruise/bleed easily.  Psychiatric/Behavioral: Negative for depression and suicidal ideas. The patient is nervous/anxious. The patient does not have insomnia.       Allergies  Allergen Reactions  . Mobic [Meloxicam] Nausea Only  . Oxybutynin Chloride Other (See Comments)    Made symptoms worse     Past Medical History:  Diagnosis Date  . Agoraphobia   . Arthritis   . Bipolar 1 disorder ([place])   . Depression   . Panic attacks   . PTSD (post-traumatic stress disorder)      Past Surgical History:  Procedure Laterality  Date  . ABDOMINAL HYSTERECTOMY    . TOE SURGERY      Social History   Social History  . Marital status: Divorced    Spouse name: N/A  .  Number of children: N/A  . Years of education: N/A   Occupational History  . Not on file.   Social History Main Topics  . Smoking status: Never Smoker  . Smokeless tobacco: Never Used  . Alcohol use No  . Drug use: Yes    Types: "Crack" cocaine, Heroin, Hashish, Marijuana, LSD, Methamphetamines, PCP, Amphetamines, Barbituates, Mescaline     Comment: per pt in teens   . Sexual activity: Yes   Other Topics Concern  . Not on file   Social History Narrative  . No narrative on file    Family History  Problem Relation Age of Onset  . Lung cancer Mother   . Bone cancer Father   . Cervical cancer Sister   . Diabetes Sister      Current Outpatient Prescriptions:  .  ALPRAZolam (XANAX) 1 MG tablet, Take by mouth. , Disp: , Rfl:  .  Ascorbic Acid (VITAMIN C WITH ROSE HIPS) 1000 MG tablet, Take 1,000 mg by mouth daily., Disp: , Rfl:  .  HYDROcodone-acetaminophen (NORCO/VICODIN) 5-325 MG tablet, , Disp: , Rfl:  .  NON FORMULARY, , Disp: , Rfl:  .  NON FORMULARY, , Disp: , Rfl:  .  potassium bicarbonate (K-LYTE) 25 MEQ disintegrating tablet, Take 25 mEq by mouth daily. , Disp: , Rfl:  .  tiZANidine (ZANAFLEX) 4 MG tablet, Take 4 mg by mouth. , Disp: , Rfl:  .  venlafaxine XR (EFFEXOR-XR) 75 MG 24 hr capsule, Take 225 mg by mouth. , Disp: , Rfl:  .  vitamin B-12 (CYANOCOBALAMIN) 1000 MCG tablet, Take by mouth daily., Disp: , Rfl:  .  ziprasidone (GEODON) 80 MG capsule, Take 80 mg by mouth., Disp: , Rfl:  .  cholecalciferol (VITAMIN D) 1000 units tablet, Take 1,000 Units by mouth daily., Disp: , Rfl:  .  pyridOXINE (VITAMIN B-6) 100 MG tablet, Take 100 mg by mouth daily., Disp: , Rfl:   Physical exam:  Vitals:   08/03/16 1123  BP: 116/84  Pulse: 74  Resp: 18  Temp: 98.1 F (36.7 C)  TempSrc: Tympanic  Weight: 190 lb (86.2 kg)  Height: 5' 11.06" (1.805 m)   Physical Exam  Constitutional: She is oriented to person, place, and time and well-developed, well-nourished, and in no  distress.  HENT:  Head: Normocephalic and atraumatic.  Eyes: EOM are normal. Pupils are equal, round, and reactive to light.  Neck: Normal range of motion.  Cardiovascular: Normal rate, regular rhythm and normal heart sounds.   Pulmonary/Chest: Effort normal and breath sounds normal.  Abdominal: Soft. Bowel sounds are normal.  Neurological: She is alert and oriented to person, place, and time.  Skin: Skin is warm and dry.  Few scattered areas of ecchymoses noted over b/l forearms     CMP Latest Ref Rng & Units 07/19/2016  Glucose 65 - 99 mg/dL 100(H)  BUN 6 - 20 mg/dL 14  Creatinine 0.44 - 1.00 mg/dL 0.89  Sodium 135 - 145 mmol/L 138  Potassium 3.5 - 5.1 mmol/L 3.8  Chloride 101 - 111 mmol/L 102  CO2 22 - 32 mmol/L 28  Calcium 8.9 - 10.3 mg/dL 9.7  Total Protein 6.5 - 8.1 g/dL 9.2(H)  Total Bilirubin 0.3 - 1.2 mg/dL 0.7  Alkaline Phos 38 - 126 U/L  66  AST 15 - 41 U/L 30  ALT 14 - 54 U/L 21   CBC Latest Ref Rng & Units 07/19/2016  WBC 3.6 - 11.0 K/uL 8.3  Hemoglobin 12.0 - 16.0 g/dL 15.1  Hematocrit 35.0 - 47.0 % 45.3  Platelets 150 - 440 K/uL 261     Assessment and plan- Patient is a 61 y.o. female referred to [place] for:  1. Easy bruising- I discussed the results of bloodwork with the patientwhich does not reveal any coagulation issues that would suggest any bleeding disorder. Bruising over forearms may be related to skin fragility. She is now off her turmeric supplements. Trazodone and geodon do not cause these side effects. She does have mildly elevated factor VIII level which is non specific and does not predispose to bleeding or clotting by itself  2. Increased "mixed count" mainly eosinophilia- this was mild in the past and not found on repeat testing. No further bloodwork required at this point.  3. Increase in total protein noted on bmp. Isolated value. No prior abnormal values in recent past. Can be followed by pcp and refer if it remains elevated  Patient can continue  to f/u with her pcp at this point and does not need follow up with hematology at this point   Visit Diagnosis 1. Easy bruising   2. Eosinophilic leukocytosis      Dr. Randa Evens, MD, MPH [facility] at [facility] Pager- [phone] 08/03/2016 11:47 AM
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
## 2016-08-05 DIAGNOSIS — M9901 Segmental and somatic dysfunction of cervical region: Secondary | ICD-10-CM | POA: Diagnosis not present

## 2016-08-05 DIAGNOSIS — M9902 Segmental and somatic dysfunction of thoracic region: Secondary | ICD-10-CM | POA: Diagnosis not present

## 2016-08-05 DIAGNOSIS — M50322 Other cervical disc degeneration at C5-C6 level: Secondary | ICD-10-CM | POA: Diagnosis not present

## 2016-08-05 DIAGNOSIS — M5134 Other intervertebral disc degeneration, thoracic region: Secondary | ICD-10-CM | POA: Diagnosis not present

## 2016-08-11 DIAGNOSIS — M9902 Segmental and somatic dysfunction of thoracic region: Secondary | ICD-10-CM | POA: Diagnosis not present

## 2016-08-11 DIAGNOSIS — M5134 Other intervertebral disc degeneration, thoracic region: Secondary | ICD-10-CM | POA: Diagnosis not present

## 2016-08-11 DIAGNOSIS — M9901 Segmental and somatic dysfunction of cervical region: Secondary | ICD-10-CM | POA: Diagnosis not present

## 2016-08-11 DIAGNOSIS — M50322 Other cervical disc degeneration at C5-C6 level: Secondary | ICD-10-CM | POA: Diagnosis not present

## 2016-08-18 DIAGNOSIS — S0501XA Injury of conjunctiva and corneal abrasion without foreign body, right eye, initial encounter: Secondary | ICD-10-CM | POA: Diagnosis not present

## 2016-08-27 DIAGNOSIS — H52222 Regular astigmatism, left eye: Secondary | ICD-10-CM | POA: Diagnosis not present

## 2016-08-27 DIAGNOSIS — H524 Presbyopia: Secondary | ICD-10-CM | POA: Diagnosis not present

## 2016-08-27 DIAGNOSIS — H5213 Myopia, bilateral: Secondary | ICD-10-CM | POA: Diagnosis not present

## 2016-08-27 DIAGNOSIS — H26033 Infantile and juvenile nuclear cataract, bilateral: Secondary | ICD-10-CM | POA: Diagnosis not present

## 2016-09-09 DIAGNOSIS — M9901 Segmental and somatic dysfunction of cervical region: Secondary | ICD-10-CM | POA: Diagnosis not present

## 2016-09-09 DIAGNOSIS — M9902 Segmental and somatic dysfunction of thoracic region: Secondary | ICD-10-CM | POA: Diagnosis not present

## 2016-09-09 DIAGNOSIS — M5134 Other intervertebral disc degeneration, thoracic region: Secondary | ICD-10-CM | POA: Diagnosis not present

## 2016-09-09 DIAGNOSIS — M50322 Other cervical disc degeneration at C5-C6 level: Secondary | ICD-10-CM | POA: Diagnosis not present

## 2016-09-15 DIAGNOSIS — Z0000 Encounter for general adult medical examination without abnormal findings: Secondary | ICD-10-CM | POA: Diagnosis not present

## 2016-09-15 DIAGNOSIS — M5412 Radiculopathy, cervical region: Secondary | ICD-10-CM | POA: Diagnosis not present

## 2016-09-15 DIAGNOSIS — M1712 Unilateral primary osteoarthritis, left knee: Secondary | ICD-10-CM | POA: Diagnosis not present

## 2016-09-15 DIAGNOSIS — Z23 Encounter for immunization: Secondary | ICD-10-CM | POA: Diagnosis not present

## 2016-09-15 DIAGNOSIS — M1711 Unilateral primary osteoarthritis, right knee: Secondary | ICD-10-CM | POA: Diagnosis not present

## 2016-09-15 DIAGNOSIS — G8929 Other chronic pain: Secondary | ICD-10-CM | POA: Diagnosis not present

## 2016-09-15 DIAGNOSIS — M25512 Pain in left shoulder: Secondary | ICD-10-CM | POA: Diagnosis not present

## 2016-09-28 DIAGNOSIS — F3181 Bipolar II disorder: Secondary | ICD-10-CM | POA: Diagnosis not present

## 2016-09-28 DIAGNOSIS — F4001 Agoraphobia with panic disorder: Secondary | ICD-10-CM | POA: Diagnosis not present

## 2016-10-13 DIAGNOSIS — R1032 Left lower quadrant pain: Secondary | ICD-10-CM | POA: Diagnosis not present

## 2016-11-11 DIAGNOSIS — Z1231 Encounter for screening mammogram for malignant neoplasm of breast: Secondary | ICD-10-CM | POA: Diagnosis not present

## 2016-11-25 DIAGNOSIS — M9904 Segmental and somatic dysfunction of sacral region: Secondary | ICD-10-CM | POA: Diagnosis not present

## 2016-11-25 DIAGNOSIS — M5136 Other intervertebral disc degeneration, lumbar region: Secondary | ICD-10-CM | POA: Diagnosis not present

## 2016-11-25 DIAGNOSIS — M9903 Segmental and somatic dysfunction of lumbar region: Secondary | ICD-10-CM | POA: Diagnosis not present

## 2016-11-25 DIAGNOSIS — M9905 Segmental and somatic dysfunction of pelvic region: Secondary | ICD-10-CM | POA: Diagnosis not present

## 2016-12-09 DIAGNOSIS — Z01419 Encounter for gynecological examination (general) (routine) without abnormal findings: Secondary | ICD-10-CM | POA: Diagnosis not present

## 2016-12-09 DIAGNOSIS — Z1211 Encounter for screening for malignant neoplasm of colon: Secondary | ICD-10-CM | POA: Diagnosis not present

## 2016-12-17 DIAGNOSIS — M170 Bilateral primary osteoarthritis of knee: Secondary | ICD-10-CM | POA: Diagnosis not present

## 2016-12-31 DIAGNOSIS — F4001 Agoraphobia with panic disorder: Secondary | ICD-10-CM | POA: Diagnosis not present

## 2016-12-31 DIAGNOSIS — F3181 Bipolar II disorder: Secondary | ICD-10-CM | POA: Diagnosis not present

## 2017-01-06 DIAGNOSIS — M9903 Segmental and somatic dysfunction of lumbar region: Secondary | ICD-10-CM | POA: Diagnosis not present

## 2017-01-06 DIAGNOSIS — M5136 Other intervertebral disc degeneration, lumbar region: Secondary | ICD-10-CM | POA: Diagnosis not present

## 2017-01-06 DIAGNOSIS — M9905 Segmental and somatic dysfunction of pelvic region: Secondary | ICD-10-CM | POA: Diagnosis not present

## 2017-01-06 DIAGNOSIS — M9904 Segmental and somatic dysfunction of sacral region: Secondary | ICD-10-CM | POA: Diagnosis not present

## 2017-01-17 DIAGNOSIS — R1032 Left lower quadrant pain: Secondary | ICD-10-CM | POA: Diagnosis not present

## 2017-01-17 DIAGNOSIS — K5900 Constipation, unspecified: Secondary | ICD-10-CM | POA: Diagnosis not present

## 2017-01-21 DIAGNOSIS — R1032 Left lower quadrant pain: Secondary | ICD-10-CM | POA: Diagnosis not present

## 2017-01-21 DIAGNOSIS — K5900 Constipation, unspecified: Secondary | ICD-10-CM | POA: Diagnosis not present

## 2017-01-26 ENCOUNTER — Other Ambulatory Visit: Payer: Self-pay | Admitting: Family Medicine

## 2017-01-26 DIAGNOSIS — K5732 Diverticulitis of large intestine without perforation or abscess without bleeding: Secondary | ICD-10-CM | POA: Diagnosis not present

## 2017-01-26 DIAGNOSIS — K5909 Other constipation: Secondary | ICD-10-CM | POA: Diagnosis not present

## 2017-01-26 DIAGNOSIS — R1031 Right lower quadrant pain: Secondary | ICD-10-CM | POA: Diagnosis not present

## 2017-01-26 DIAGNOSIS — R1032 Left lower quadrant pain: Secondary | ICD-10-CM | POA: Diagnosis not present

## 2017-01-27 ENCOUNTER — Other Ambulatory Visit: Payer: Self-pay | Admitting: Family Medicine

## 2017-01-27 DIAGNOSIS — R109 Unspecified abdominal pain: Secondary | ICD-10-CM

## 2017-02-03 ENCOUNTER — Ambulatory Visit
Admission: RE | Admit: 2017-02-03 | Discharge: 2017-02-03 | Disposition: A | Discharge status: Home / Self Care | Payer: PPO | Source: Ambulatory Visit | Attending: Family Medicine | Admitting: Family Medicine

## 2017-02-03 DIAGNOSIS — K5730 Diverticulosis of large intestine without perforation or abscess without bleeding: Secondary | ICD-10-CM | POA: Diagnosis not present

## 2017-02-03 DIAGNOSIS — R109 Unspecified abdominal pain: Secondary | ICD-10-CM | POA: Diagnosis not present

## 2017-02-03 DIAGNOSIS — K5900 Constipation, unspecified: Secondary | ICD-10-CM | POA: Diagnosis not present

## 2017-03-10 DIAGNOSIS — M25572 Pain in left ankle and joints of left foot: Secondary | ICD-10-CM | POA: Diagnosis not present

## 2017-03-10 DIAGNOSIS — S92302A Fracture of unspecified metatarsal bone(s), left foot, initial encounter for closed fracture: Secondary | ICD-10-CM | POA: Diagnosis not present

## 2017-03-10 DIAGNOSIS — M79672 Pain in left foot: Secondary | ICD-10-CM | POA: Diagnosis not present

## 2017-03-10 DIAGNOSIS — S93492A Sprain of other ligament of left ankle, initial encounter: Secondary | ICD-10-CM | POA: Diagnosis not present

## 2017-03-17 DIAGNOSIS — S92302D Fracture of unspecified metatarsal bone(s), left foot, subsequent encounter for fracture with routine healing: Secondary | ICD-10-CM | POA: Diagnosis not present

## 2017-03-30 DIAGNOSIS — S93492D Sprain of other ligament of left ankle, subsequent encounter: Secondary | ICD-10-CM | POA: Diagnosis not present

## 2017-03-30 DIAGNOSIS — S92302D Fracture of unspecified metatarsal bone(s), left foot, subsequent encounter for fracture with routine healing: Secondary | ICD-10-CM | POA: Diagnosis not present

## 2017-04-07 DIAGNOSIS — M5033 Other cervical disc degeneration, cervicothoracic region: Secondary | ICD-10-CM | POA: Diagnosis not present

## 2017-04-07 DIAGNOSIS — M9901 Segmental and somatic dysfunction of cervical region: Secondary | ICD-10-CM | POA: Diagnosis not present

## 2017-04-20 DIAGNOSIS — S92302D Fracture of unspecified metatarsal bone(s), left foot, subsequent encounter for fracture with routine healing: Secondary | ICD-10-CM | POA: Diagnosis not present

## 2017-04-20 DIAGNOSIS — S93492D Sprain of other ligament of left ankle, subsequent encounter: Secondary | ICD-10-CM | POA: Diagnosis not present

## 2017-04-21 IMAGING — MR MR THORACIC SPINE W/O CM
4 of 6 series · 25 of 48 positions shown · non-contrast
Comparison: MRI cervical 06/03/2009

CLINICAL DATA: Mid thoracic back pain with right shoulder pain.
Duration of symptoms 6 months, worsening recently.

EXAM:
MRI THORACIC SPINE WITHOUT CONTRAST
TECHNIQUE: Multiplanar, multisequence MR imaging of the thoracic spine was
performed. No intravenous contrast was administered.

[Series 4: T2 · sagittal · 3.0mm · 0.83mm/px · 5 of 16 slices shown (1 of 2)]
[im 1/16]
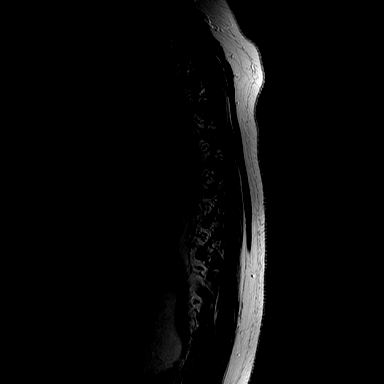
[im 4/16]
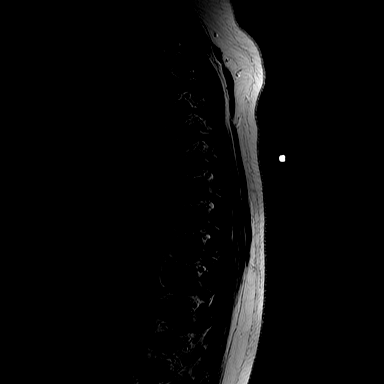
[im 8/16]
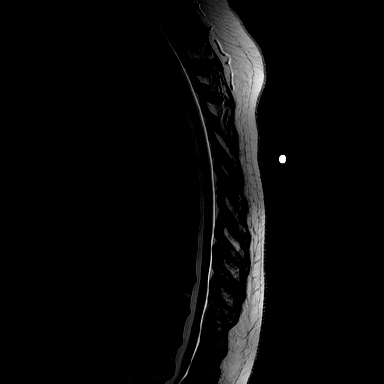
[im 12/16]
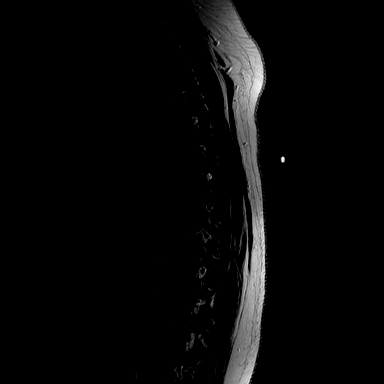
[im 16/16]
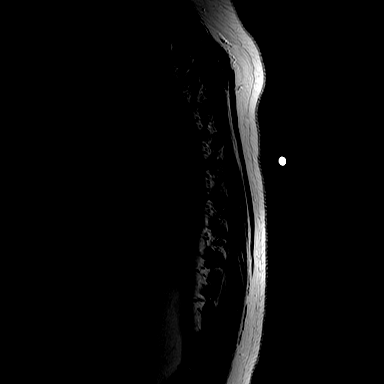

[Series 5: T1 · sagittal · 3.0mm · 0.62mm/px · 5 of 16 slices shown]
[im 1/16]
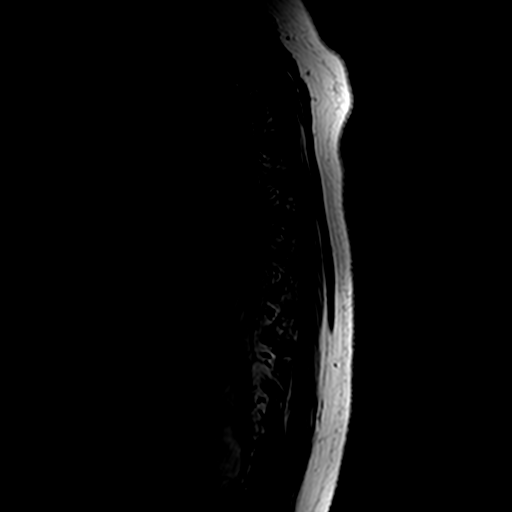
[im 4/16]
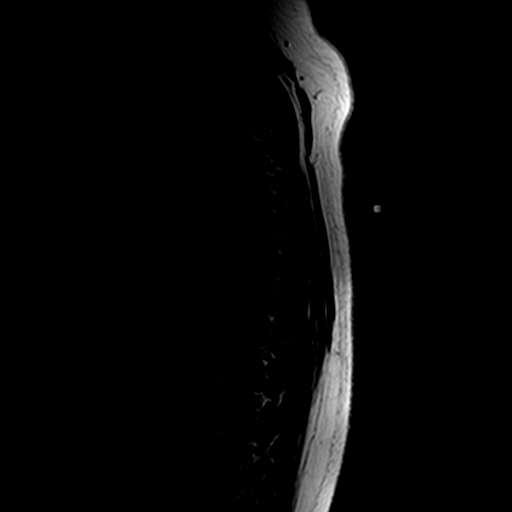
[im 8/16]
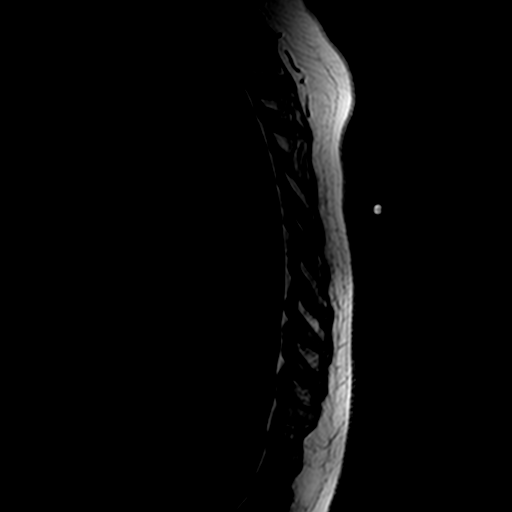
[im 12/16]
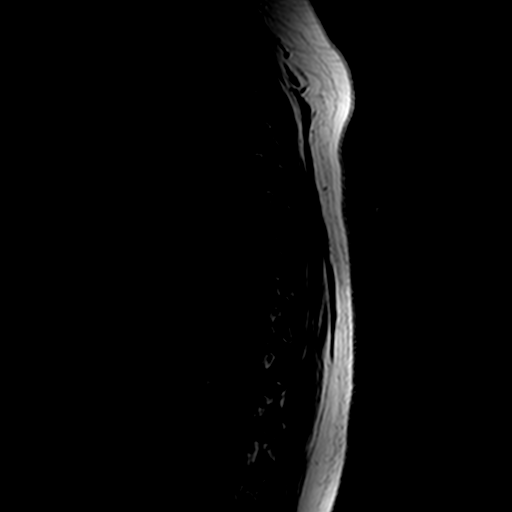
[im 16/16]
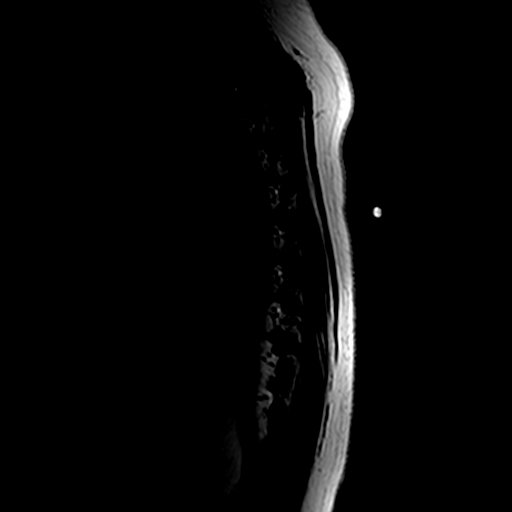

[Series 7: STIR · sagittal · 3.0mm · 0.62mm/px · 6 of 16 slices shown]
[im 1/16]
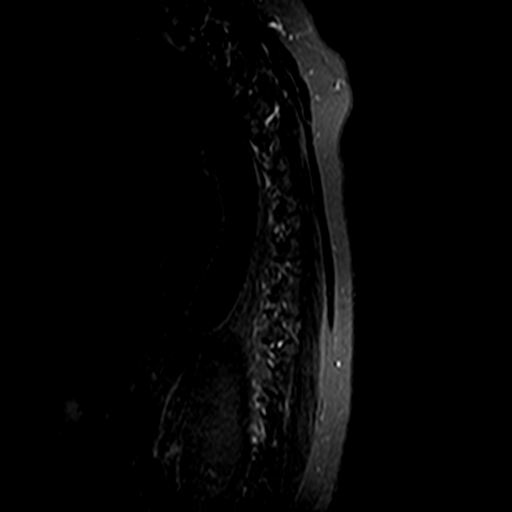
[im 4/16]
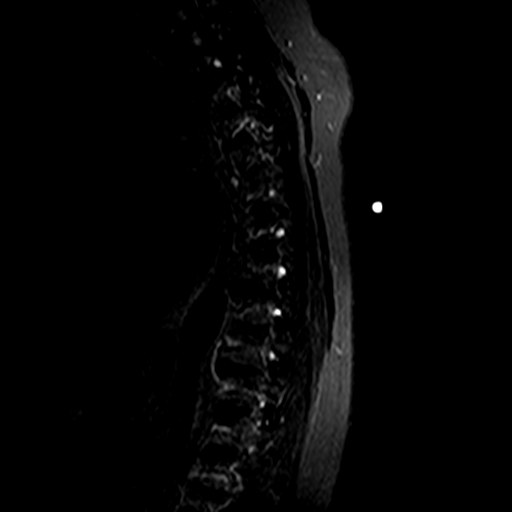
[im 7/16]
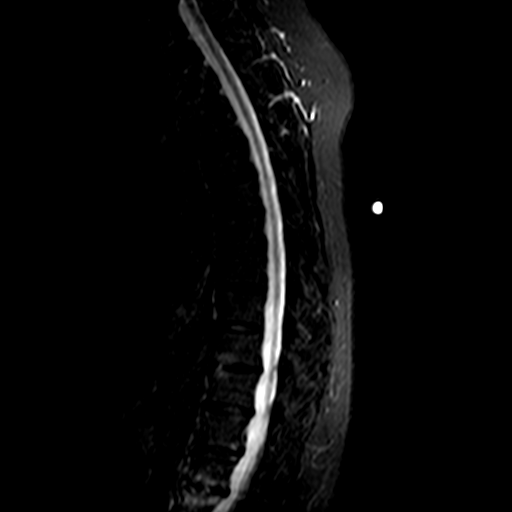
[im 10/16]
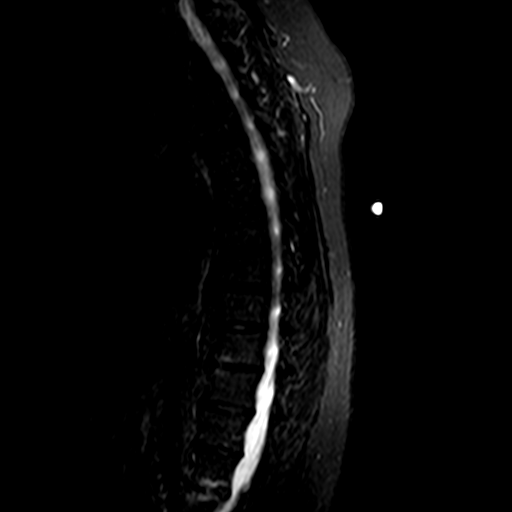
[im 13/16]
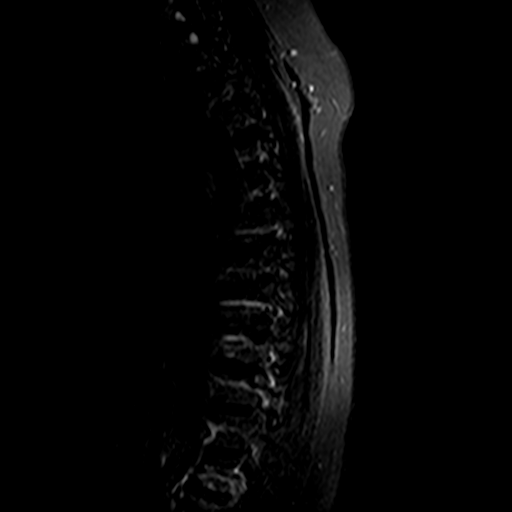
[im 16/16]
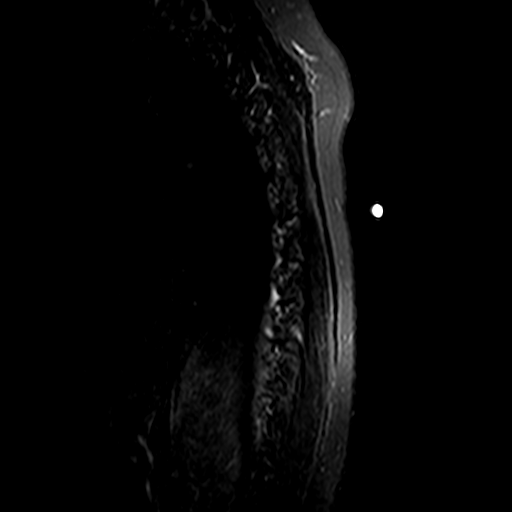

[Series 8: T2 · axial · 5.0mm · 0.57mm/px · z∈[-188,+47]mm · 9 of 42 slices shown (2 of 2)]
[im 1/42]
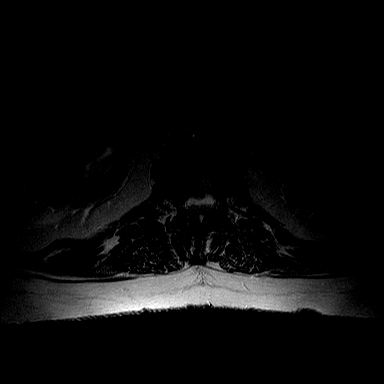
[im 6/42]
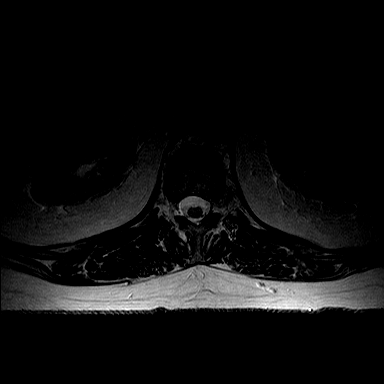
[im 12/42]
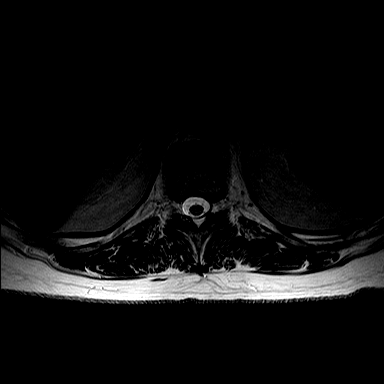
[im 18/42]
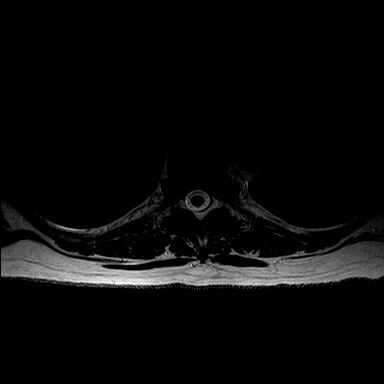
[im 21/42]
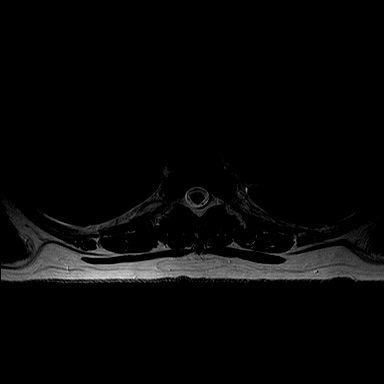
[im 24/42]
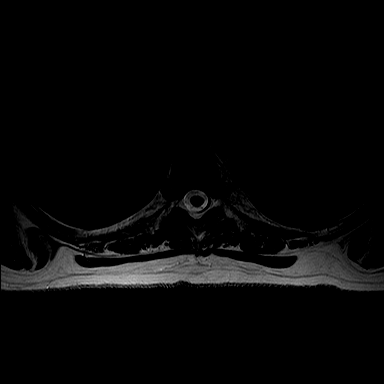
[im 30/42]
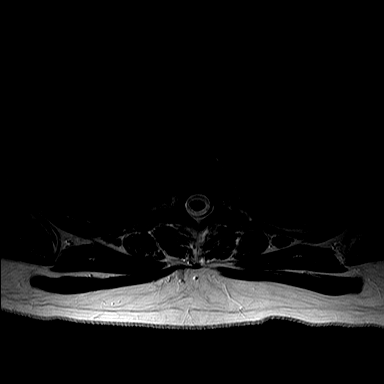
[im 36/42]
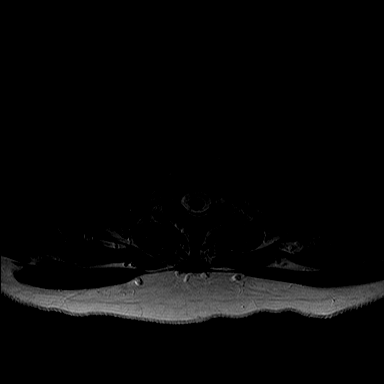
[im 42/42]
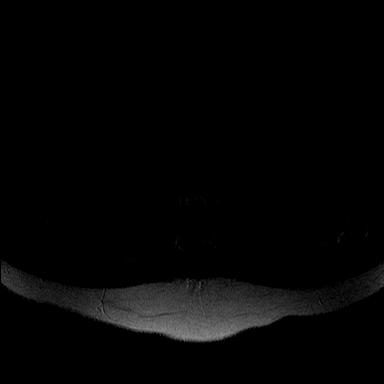

[25 of 48 positions shown; findings below may reference images not displayed]

FINDINGS: T1-2:  Normal interspace.

T2-3:  Normal interspace.

T3-4: No disc pathology. Mild facet degeneration on the right. No
stenosis.

T4-5: No disc pathology. Mild facet degeneration on the right. No
stenosis.

T5-6:  Normal interspace.

T6-7:  Minimal bulging of the disc.  No canal or foraminal stenosis.

T7-8:  No disc pathology.  Mild facet hypertrophy.  No stenosis.

T8-9:  No disc pathology.  Mild facet hypertrophy.  No stenosis.

T9-10: Small right paracentral disc protrusion indents the thecal
sac slightly but does not cause neural compression. Mild facet
degeneration bilaterally.

T10-11: Desiccation and bulging of the disc. Indentation of the
thecal sac but no neural compression. Mild facet degeneration

T11-12: Desiccation and bulging of the disc. Slight indentation of
the thecal sac but no neural compression. Mild facet degeneration.

T12-L1:  Mild bulging of the disc.  No stenosis.

L1-2: Central disc herniation indents the thecal sac but does not
cause visible neural compression.

No abnormal cord signal.
IMPRESSION: Ordinary mild age related degenerative changes throughout the
thoracic spine. No evidence of advanced disease. No stenosis or
neural compression. Findings could relate to back pain in general.

## 2017-05-04 DIAGNOSIS — M170 Bilateral primary osteoarthritis of knee: Secondary | ICD-10-CM | POA: Diagnosis not present

## 2017-05-05 DIAGNOSIS — F3181 Bipolar II disorder: Secondary | ICD-10-CM | POA: Diagnosis not present

## 2017-05-05 DIAGNOSIS — F4001 Agoraphobia with panic disorder: Secondary | ICD-10-CM | POA: Diagnosis not present

## 2017-05-19 DIAGNOSIS — S93492D Sprain of other ligament of left ankle, subsequent encounter: Secondary | ICD-10-CM | POA: Diagnosis not present

## 2017-05-19 DIAGNOSIS — S92302D Fracture of unspecified metatarsal bone(s), left foot, subsequent encounter for fracture with routine healing: Secondary | ICD-10-CM | POA: Diagnosis not present

## 2017-06-02 DIAGNOSIS — F4001 Agoraphobia with panic disorder: Secondary | ICD-10-CM | POA: Diagnosis not present

## 2017-06-02 DIAGNOSIS — F3181 Bipolar II disorder: Secondary | ICD-10-CM | POA: Diagnosis not present

## 2017-06-23 DIAGNOSIS — M79672 Pain in left foot: Secondary | ICD-10-CM | POA: Diagnosis not present

## 2017-06-23 DIAGNOSIS — S92355D Nondisplaced fracture of fifth metatarsal bone, left foot, subsequent encounter for fracture with routine healing: Secondary | ICD-10-CM | POA: Diagnosis not present

## 2017-07-01 DIAGNOSIS — F4001 Agoraphobia with panic disorder: Secondary | ICD-10-CM | POA: Diagnosis not present

## 2017-07-01 DIAGNOSIS — F3181 Bipolar II disorder: Secondary | ICD-10-CM | POA: Diagnosis not present

## 2017-07-05 DIAGNOSIS — M5031 Other cervical disc degeneration,  high cervical region: Secondary | ICD-10-CM | POA: Diagnosis not present

## 2017-07-05 DIAGNOSIS — M9901 Segmental and somatic dysfunction of cervical region: Secondary | ICD-10-CM | POA: Diagnosis not present

## 2017-07-05 DIAGNOSIS — M5134 Other intervertebral disc degeneration, thoracic region: Secondary | ICD-10-CM | POA: Diagnosis not present

## 2017-07-05 DIAGNOSIS — M9902 Segmental and somatic dysfunction of thoracic region: Secondary | ICD-10-CM | POA: Diagnosis not present

## 2017-07-06 DIAGNOSIS — M9901 Segmental and somatic dysfunction of cervical region: Secondary | ICD-10-CM | POA: Diagnosis not present

## 2017-07-06 DIAGNOSIS — M5031 Other cervical disc degeneration,  high cervical region: Secondary | ICD-10-CM | POA: Diagnosis not present

## 2017-07-06 DIAGNOSIS — M9902 Segmental and somatic dysfunction of thoracic region: Secondary | ICD-10-CM | POA: Diagnosis not present

## 2017-07-06 DIAGNOSIS — M5134 Other intervertebral disc degeneration, thoracic region: Secondary | ICD-10-CM | POA: Diagnosis not present

## 2017-07-07 DIAGNOSIS — M9901 Segmental and somatic dysfunction of cervical region: Secondary | ICD-10-CM | POA: Diagnosis not present

## 2017-07-07 DIAGNOSIS — M9902 Segmental and somatic dysfunction of thoracic region: Secondary | ICD-10-CM | POA: Diagnosis not present

## 2017-07-07 DIAGNOSIS — M5031 Other cervical disc degeneration,  high cervical region: Secondary | ICD-10-CM | POA: Diagnosis not present

## 2017-07-07 DIAGNOSIS — M5134 Other intervertebral disc degeneration, thoracic region: Secondary | ICD-10-CM | POA: Diagnosis not present

## 2017-07-11 DIAGNOSIS — M9902 Segmental and somatic dysfunction of thoracic region: Secondary | ICD-10-CM | POA: Diagnosis not present

## 2017-07-11 DIAGNOSIS — M5134 Other intervertebral disc degeneration, thoracic region: Secondary | ICD-10-CM | POA: Diagnosis not present

## 2017-07-11 DIAGNOSIS — M9901 Segmental and somatic dysfunction of cervical region: Secondary | ICD-10-CM | POA: Diagnosis not present

## 2017-07-11 DIAGNOSIS — M5031 Other cervical disc degeneration,  high cervical region: Secondary | ICD-10-CM | POA: Diagnosis not present

## 2017-07-13 DIAGNOSIS — M5134 Other intervertebral disc degeneration, thoracic region: Secondary | ICD-10-CM | POA: Diagnosis not present

## 2017-07-13 DIAGNOSIS — M9902 Segmental and somatic dysfunction of thoracic region: Secondary | ICD-10-CM | POA: Diagnosis not present

## 2017-07-13 DIAGNOSIS — M9901 Segmental and somatic dysfunction of cervical region: Secondary | ICD-10-CM | POA: Diagnosis not present

## 2017-07-13 DIAGNOSIS — M5031 Other cervical disc degeneration,  high cervical region: Secondary | ICD-10-CM | POA: Diagnosis not present

## 2017-07-18 DIAGNOSIS — M222X2 Patellofemoral disorders, left knee: Secondary | ICD-10-CM | POA: Diagnosis not present

## 2017-07-18 DIAGNOSIS — M1712 Unilateral primary osteoarthritis, left knee: Secondary | ICD-10-CM | POA: Diagnosis not present

## 2017-07-19 DIAGNOSIS — R51 Headache: Secondary | ICD-10-CM | POA: Diagnosis not present

## 2017-07-19 DIAGNOSIS — M47812 Spondylosis without myelopathy or radiculopathy, cervical region: Secondary | ICD-10-CM | POA: Diagnosis not present

## 2017-07-19 DIAGNOSIS — M25511 Pain in right shoulder: Secondary | ICD-10-CM | POA: Diagnosis not present

## 2017-07-19 DIAGNOSIS — M5031 Other cervical disc degeneration,  high cervical region: Secondary | ICD-10-CM | POA: Diagnosis not present

## 2017-07-19 DIAGNOSIS — M9901 Segmental and somatic dysfunction of cervical region: Secondary | ICD-10-CM | POA: Diagnosis not present

## 2017-07-19 DIAGNOSIS — M19011 Primary osteoarthritis, right shoulder: Secondary | ICD-10-CM | POA: Diagnosis not present

## 2017-07-19 DIAGNOSIS — M9902 Segmental and somatic dysfunction of thoracic region: Secondary | ICD-10-CM | POA: Diagnosis not present

## 2017-07-19 DIAGNOSIS — M5134 Other intervertebral disc degeneration, thoracic region: Secondary | ICD-10-CM | POA: Diagnosis not present

## 2017-07-19 DIAGNOSIS — M542 Cervicalgia: Secondary | ICD-10-CM | POA: Diagnosis not present

## 2017-07-25 DIAGNOSIS — M5134 Other intervertebral disc degeneration, thoracic region: Secondary | ICD-10-CM | POA: Diagnosis not present

## 2017-07-25 DIAGNOSIS — M9901 Segmental and somatic dysfunction of cervical region: Secondary | ICD-10-CM | POA: Diagnosis not present

## 2017-07-25 DIAGNOSIS — M9902 Segmental and somatic dysfunction of thoracic region: Secondary | ICD-10-CM | POA: Diagnosis not present

## 2017-07-25 DIAGNOSIS — M5031 Other cervical disc degeneration,  high cervical region: Secondary | ICD-10-CM | POA: Diagnosis not present

## 2017-07-26 DIAGNOSIS — M5412 Radiculopathy, cervical region: Secondary | ICD-10-CM | POA: Diagnosis not present

## 2017-07-27 DIAGNOSIS — M5031 Other cervical disc degeneration,  high cervical region: Secondary | ICD-10-CM | POA: Diagnosis not present

## 2017-07-27 DIAGNOSIS — M9902 Segmental and somatic dysfunction of thoracic region: Secondary | ICD-10-CM | POA: Diagnosis not present

## 2017-07-27 DIAGNOSIS — M9901 Segmental and somatic dysfunction of cervical region: Secondary | ICD-10-CM | POA: Diagnosis not present

## 2017-07-27 DIAGNOSIS — M5134 Other intervertebral disc degeneration, thoracic region: Secondary | ICD-10-CM | POA: Diagnosis not present

## 2017-07-29 DIAGNOSIS — M9901 Segmental and somatic dysfunction of cervical region: Secondary | ICD-10-CM | POA: Diagnosis not present

## 2017-07-29 DIAGNOSIS — M5031 Other cervical disc degeneration,  high cervical region: Secondary | ICD-10-CM | POA: Diagnosis not present

## 2017-07-29 DIAGNOSIS — M5134 Other intervertebral disc degeneration, thoracic region: Secondary | ICD-10-CM | POA: Diagnosis not present

## 2017-07-29 DIAGNOSIS — M9902 Segmental and somatic dysfunction of thoracic region: Secondary | ICD-10-CM | POA: Diagnosis not present

## 2017-08-01 DIAGNOSIS — M5412 Radiculopathy, cervical region: Secondary | ICD-10-CM | POA: Diagnosis not present

## 2017-08-02 DIAGNOSIS — M5134 Other intervertebral disc degeneration, thoracic region: Secondary | ICD-10-CM | POA: Diagnosis not present

## 2017-08-02 DIAGNOSIS — M9902 Segmental and somatic dysfunction of thoracic region: Secondary | ICD-10-CM | POA: Diagnosis not present

## 2017-08-02 DIAGNOSIS — M9901 Segmental and somatic dysfunction of cervical region: Secondary | ICD-10-CM | POA: Diagnosis not present

## 2017-08-02 DIAGNOSIS — M5031 Other cervical disc degeneration,  high cervical region: Secondary | ICD-10-CM | POA: Diagnosis not present

## 2017-08-04 DIAGNOSIS — M5134 Other intervertebral disc degeneration, thoracic region: Secondary | ICD-10-CM | POA: Diagnosis not present

## 2017-08-04 DIAGNOSIS — M9902 Segmental and somatic dysfunction of thoracic region: Secondary | ICD-10-CM | POA: Diagnosis not present

## 2017-08-04 DIAGNOSIS — M5031 Other cervical disc degeneration,  high cervical region: Secondary | ICD-10-CM | POA: Diagnosis not present

## 2017-08-04 DIAGNOSIS — M9901 Segmental and somatic dysfunction of cervical region: Secondary | ICD-10-CM | POA: Diagnosis not present

## 2017-08-05 DIAGNOSIS — M5412 Radiculopathy, cervical region: Secondary | ICD-10-CM | POA: Diagnosis not present

## 2017-08-10 DIAGNOSIS — M5412 Radiculopathy, cervical region: Secondary | ICD-10-CM | POA: Diagnosis not present

## 2017-08-10 DIAGNOSIS — Z79891 Long term (current) use of opiate analgesic: Secondary | ICD-10-CM | POA: Diagnosis not present

## 2017-08-12 DIAGNOSIS — F4001 Agoraphobia with panic disorder: Secondary | ICD-10-CM | POA: Diagnosis not present

## 2017-08-12 DIAGNOSIS — F3181 Bipolar II disorder: Secondary | ICD-10-CM | POA: Diagnosis not present

## 2017-08-23 DIAGNOSIS — F4001 Agoraphobia with panic disorder: Secondary | ICD-10-CM | POA: Diagnosis not present

## 2017-08-23 DIAGNOSIS — F3181 Bipolar II disorder: Secondary | ICD-10-CM | POA: Diagnosis not present

## 2017-08-24 DIAGNOSIS — M5412 Radiculopathy, cervical region: Secondary | ICD-10-CM | POA: Diagnosis not present

## 2017-09-07 DIAGNOSIS — M5412 Radiculopathy, cervical region: Secondary | ICD-10-CM | POA: Diagnosis not present

## 2017-09-20 DIAGNOSIS — M9902 Segmental and somatic dysfunction of thoracic region: Secondary | ICD-10-CM | POA: Diagnosis not present

## 2017-09-20 DIAGNOSIS — M5134 Other intervertebral disc degeneration, thoracic region: Secondary | ICD-10-CM | POA: Diagnosis not present

## 2017-09-20 DIAGNOSIS — M50322 Other cervical disc degeneration at C5-C6 level: Secondary | ICD-10-CM | POA: Diagnosis not present

## 2017-09-20 DIAGNOSIS — M9901 Segmental and somatic dysfunction of cervical region: Secondary | ICD-10-CM | POA: Diagnosis not present

## 2017-10-03 DIAGNOSIS — M5412 Radiculopathy, cervical region: Secondary | ICD-10-CM | POA: Diagnosis not present

## 2017-10-03 DIAGNOSIS — M5030 Other cervical disc degeneration, unspecified cervical region: Secondary | ICD-10-CM | POA: Diagnosis not present

## 2017-10-03 DIAGNOSIS — S134XXA Sprain of ligaments of cervical spine, initial encounter: Secondary | ICD-10-CM | POA: Diagnosis not present

## 2017-10-07 DIAGNOSIS — Z780 Asymptomatic menopausal state: Secondary | ICD-10-CM | POA: Diagnosis not present

## 2017-10-28 DIAGNOSIS — M5030 Other cervical disc degeneration, unspecified cervical region: Secondary | ICD-10-CM | POA: Diagnosis not present

## 2017-10-28 DIAGNOSIS — M5412 Radiculopathy, cervical region: Secondary | ICD-10-CM | POA: Diagnosis not present

## 2017-11-14 DIAGNOSIS — M5412 Radiculopathy, cervical region: Secondary | ICD-10-CM | POA: Diagnosis not present

## 2017-11-14 DIAGNOSIS — M5030 Other cervical disc degeneration, unspecified cervical region: Secondary | ICD-10-CM | POA: Diagnosis not present

## 2017-12-01 DIAGNOSIS — R42 Dizziness and giddiness: Secondary | ICD-10-CM | POA: Diagnosis not present

## 2017-12-14 DIAGNOSIS — M5412 Radiculopathy, cervical region: Secondary | ICD-10-CM | POA: Diagnosis not present

## 2017-12-14 DIAGNOSIS — M5030 Other cervical disc degeneration, unspecified cervical region: Secondary | ICD-10-CM | POA: Diagnosis not present

## 2017-12-21 DIAGNOSIS — Z1211 Encounter for screening for malignant neoplasm of colon: Secondary | ICD-10-CM | POA: Diagnosis not present

## 2017-12-21 DIAGNOSIS — Z1239 Encounter for other screening for malignant neoplasm of breast: Secondary | ICD-10-CM | POA: Diagnosis not present

## 2017-12-21 DIAGNOSIS — Z01419 Encounter for gynecological examination (general) (routine) without abnormal findings: Secondary | ICD-10-CM | POA: Diagnosis not present

## 2017-12-28 DIAGNOSIS — M5030 Other cervical disc degeneration, unspecified cervical region: Secondary | ICD-10-CM | POA: Diagnosis not present

## 2017-12-28 DIAGNOSIS — M5412 Radiculopathy, cervical region: Secondary | ICD-10-CM | POA: Diagnosis not present

## 2018-01-11 DIAGNOSIS — M1711 Unilateral primary osteoarthritis, right knee: Secondary | ICD-10-CM | POA: Diagnosis not present

## 2018-01-11 DIAGNOSIS — M1712 Unilateral primary osteoarthritis, left knee: Secondary | ICD-10-CM | POA: Diagnosis not present

## 2018-01-16 DIAGNOSIS — F3181 Bipolar II disorder: Secondary | ICD-10-CM | POA: Diagnosis not present

## 2018-01-16 DIAGNOSIS — F4001 Agoraphobia with panic disorder: Secondary | ICD-10-CM | POA: Diagnosis not present

## 2019-05-27 ENCOUNTER — Ambulatory Visit: Payer: PPO

## 2021-01-19 ENCOUNTER — Other Ambulatory Visit: Payer: Self-pay | Admitting: Family Medicine

## 2021-01-19 DIAGNOSIS — Z1231 Encounter for screening mammogram for malignant neoplasm of breast: Secondary | ICD-10-CM

## 2021-01-19 DIAGNOSIS — Z13820 Encounter for screening for osteoporosis: Secondary | ICD-10-CM

## 2021-08-13 LAB — COLOGUARD: COLOGUARD: NEGATIVE

## 2021-09-09 ENCOUNTER — Encounter
Admission: RE | Admit: 2021-09-09 | Discharge: 2021-09-09 | Disposition: A | Discharge status: Home / Self Care | Payer: Medicare PPO | Source: Ambulatory Visit | Attending: Orthopedic Surgery | Admitting: Orthopedic Surgery

## 2021-09-09 ENCOUNTER — Encounter: Payer: Self-pay | Admitting: Orthopedic Surgery

## 2021-09-09 ENCOUNTER — Other Ambulatory Visit: Payer: Self-pay | Admitting: Orthopedic Surgery

## 2021-09-09 VITALS — Ht 70.0 in | Wt 195.0 lb

## 2021-09-09 DIAGNOSIS — J984 Other disorders of lung: Secondary | ICD-10-CM

## 2021-09-09 DIAGNOSIS — Z01818 Encounter for other preprocedural examination: Secondary | ICD-10-CM

## 2021-09-09 HISTORY — DX: Pneumonia, unspecified organism: J18.9

## 2021-09-09 HISTORY — DX: Unspecified injury of lung, unspecified, initial encounter: S27.309A

## 2021-09-09 HISTORY — DX: Malignant (primary) neoplasm, unspecified: C80.1

## 2021-09-09 NOTE — H&P (Signed)
Mindy Caldwell MRN:  [id number] DOB/SEX:  [birth year]/female  CHIEF COMPLAINT:  Painful right Knee  HISTORY: Patient is a 66 y.o. female presented with a history of pain in the right knee. Onset of symptoms was abrupt starting several weeks ago with unchanged course since that time. Prior procedures on the knee include none. Patient has been treated conservatively with over-the-counter NSAIDs and activity modification. Patient currently rates pain in the knee at 10 out of 10 with activity. There is pain at night.  PAST MEDICAL HISTORY: Patient Active Problem List   Diagnosis Date Noted   Migraines 07/19/2016   Hypertension 07/19/2016   Past Medical History:  Diagnosis Date   Agoraphobia    Arthritis    Bipolar 1 disorder ([place])    Cancer ([place])    skin cancer   Depression    Injury of lung    scarring of left lung due to pneumonia   Panic attacks    Pneumonia    scarring to left lung due to repeat pneumonia in her younger years   PTSD (post-traumatic stress disorder)    Past Surgical History:  Procedure Laterality Date   ABDOMINAL HYSTERECTOMY     COLONOSCOPY     REPLACEMENT TOTAL KNEE Bilateral    TOE SURGERY Right    half of great toe amputated as a child due to a lawnmower accident     MEDICATIONS:  (Not in a hospital admission)   ALLERGIES:   Allergies  Allergen Reactions   Mobic [Meloxicam] Nausea Only   Oxybutynin Chloride Other (See Comments)    Made symptoms worse   Prednisone Nausea And Vomiting    REVIEW OF SYSTEMS:  Pertinent items are noted in HPI.   FAMILY HISTORY:   Family History  Problem Relation Age of Onset   Lung cancer Mother    Bone cancer Father    Cervical cancer Sister    Diabetes Sister     SOCIAL HISTORY:   Social History   Tobacco Use   Smoking status: Never   Smokeless tobacco: Never  Substance Use Topics   Alcohol use: No     EXAMINATION:  Vital signs in last 24 hours: '@VSRANGES'$ @  General appearance: alert,  cooperative, and no distress Neck: no JVD and supple, symmetrical, trachea midline Lungs: clear to auscultation bilaterally Heart: regular rate and rhythm, S1, S2 normal, no murmur, click, rub or gallop Abdomen: soft, non-tender; bowel sounds normal; no masses,  no organomegaly Extremities: extremities normal, atraumatic, no cyanosis or edema and Homans sign is negative, no sign of DVT Pulses: 2+ and symmetric Skin: Skin color, texture, turgor normal. No rashes or lesions Neurologic: Alert and oriented X 3, normal strength and tone. Normal symmetric reflexes. Normal coordination and gait  Musculoskeletal:  ROM not tested Imaging Review Plain radiographs demonstrate right patellar fracture Assessment/Plan: Right knee patellar fracture  Right patella ORIF   Carlynn Spry 09/09/2021, 11:55 AM

## 2021-09-09 NOTE — Patient Instructions (Signed)
Your procedure is scheduled on:09-16-21 Wednesday Report to the Registration Desk on the 1st floor of the [facility].Then proceed to the 2nd floor Surgery Desk  To find out your arrival time, please call [phone] between 1PM - 3PM on:09-15-21 Tuesday If your arrival time is 6:00 am, do not arrive prior to that time as the [facility] entrance doors do not open until 6:00 am.  REMEMBER: Instructions that are not followed completely may result in serious medical risk, up to and including death; or upon the discretion of your surgeon and anesthesiologist your surgery may need to be rescheduled.  Do not eat food after midnight the night before surgery.  No gum chewing, lozengers or hard candies.  You may however, drink CLEAR liquids up to 2 hours before you are scheduled to arrive for your surgery. Do not drink anything within 2 hours of your scheduled arrival time.  Clear liquids include: - water  - apple juice without pulp - gatorade (not RED colors) - black coffee or tea (Do NOT add milk or creamers to the coffee or tea) Do NOT drink anything that is not on this list.  TAKE THESE MEDICATIONS THE MORNING OF SURGERY WITH A SIP OF WATER: -ALPRAZolam (XANAX) -venlafaxine [facility])  One week prior to surgery: Stop Anti-inflammatories (NSAIDS) such as Advil, Aleve, Ibuprofen, Motrin, Naproxen, Naprosyn and Aspirin based products such as Excedrin, Goodys Powder, BC Powder.You may however, take Tylenol/Hydrocodone if needed for pain up until the day of surgery. Stop ANY OVER THE COUNTER supplements/vitamins NOW (09-09-21) until after surgery.  No Alcohol for 24 hours before or after surgery.  No Smoking including e-cigarettes for 24 hours prior to surgery.  No chewable tobacco products for at least 6 hours prior to surgery.  No nicotine patches on the day of surgery.  Do not use any "recreational" drugs for at least a week prior to your surgery.  Please be advised that the combination  of cocaine and anesthesia may have negative outcomes, up to and including death. If you test positive for cocaine, your surgery will be cancelled.  On the morning of surgery brush your teeth with toothpaste and water, you may rinse your mouth with mouthwash if you wish. Do not swallow any toothpaste or mouthwash.  Use CHG Soap as directed on instruction sheet.  Do not wear jewelry, make-up, hairpins, clips or nail polish.  Do not wear lotions, powders, or perfumes.   Do not shave body from the neck down 48 hours prior to surgery just in case you cut yourself which could leave a site for infection.  Also, freshly shaved skin may become irritated if using the CHG soap.  Contact lenses, hearing aids and dentures may not be worn into surgery.  Do not bring valuables to the hospital. [facility] is not responsible for any missing/lost belongings or valuables.  Notify your doctor if there is any change in your medical condition (cold, fever, infection).  Wear comfortable clothing (specific to your surgery type) to the hospital.  After surgery, you can help prevent lung complications by doing breathing exercises.  Take deep breaths and cough every 1-2 hours. Your doctor may order a device called an Incentive Spirometer to help you take deep breaths. When coughing or sneezing, hold a pillow firmly against your incision with both hands. This is called "splinting." Doing this helps protect your incision. It also decreases belly discomfort.  If you are being admitted to the hospital overnight, leave your suitcase in the  car. After surgery it may be brought to your room.  If you are being discharged the day of surgery, you will not be allowed to drive home. You will need a responsible adult (18 years or older) to drive you home and stay with you that night.   If you are taking public transportation, you will need to have a responsible adult (18 years or older) with you. Please confirm with your  physician that it is acceptable to use public transportation.   Please call the [facility] at [phone] if you have any questions about these instructions.  Surgery Visitation Policy:  Patients undergoing a surgery or procedure may have two family members or support persons with them as long as the person is not COVID-19 positive or experiencing its symptoms.

## 2021-09-10 ENCOUNTER — Encounter
Admission: RE | Admit: 2021-09-10 | Discharge: 2021-09-10 | Disposition: A | Discharge status: Home / Self Care | Payer: Medicare PPO | Source: Ambulatory Visit | Attending: Orthopedic Surgery | Admitting: Orthopedic Surgery

## 2021-09-10 ENCOUNTER — Other Ambulatory Visit: Payer: Self-pay

## 2021-09-10 DIAGNOSIS — Z01818 Encounter for other preprocedural examination: Secondary | ICD-10-CM | POA: Diagnosis not present

## 2021-09-10 DIAGNOSIS — J984 Other disorders of lung: Secondary | ICD-10-CM

## 2021-09-10 LAB — CBC
HCT: 43.4 % (ref 36.0–46.0)
Hemoglobin: 14.2 g/dL (ref 12.0–15.0)
MCH: 30.7 pg (ref 26.0–34.0)
MCHC: 32.7 g/dL (ref 30.0–36.0)
MCV: 93.7 fL (ref 80.0–100.0)
Platelets: 277 10*3/uL (ref 150–400)
RBC: 4.63 MIL/uL (ref 3.87–5.11)
RDW: 13 % (ref 11.5–15.5)
WBC: 9.6 10*3/uL (ref 4.0–10.5)
nRBC: 0 % (ref 0.0–0.2)

## 2021-09-10 LAB — BASIC METABOLIC PANEL
Anion gap: 8 (ref 5–15)
BUN: 14 mg/dL (ref 8–23)
CO2: 28 mmol/L (ref 22–32)
Calcium: 9.5 mg/dL (ref 8.9–10.3)
Chloride: 103 mmol/L (ref 98–111)
Creatinine, Ser: 1.06 mg/dL — ABNORMAL HIGH (ref 0.44–1.00)
GFR, Estimated: 57 mL/min — ABNORMAL LOW (ref >60)
Glucose, Bld: 93 mg/dL (ref 70–99)
Potassium: 3.7 mmol/L (ref 3.5–5.1)
Sodium: 139 mmol/L (ref 135–145)

## 2021-09-15 MED ORDER — CHLORHEXIDINE GLUCONATE 0.12 % MT SOLN: 15.0000 mL | Freq: Once | OROMUCOSAL | Status: AC

## 2021-09-15 MED ORDER — FAMOTIDINE 20 MG PO TABS: 20.0000 mg | ORAL_TABLET | Freq: Once | ORAL | Status: AC

## 2021-09-15 MED ORDER — LACTATED RINGERS IV SOLN: INTRAVENOUS | Status: DC

## 2021-09-15 MED ORDER — GABAPENTIN 600 MG PO TABS
300.0000 mg | ORAL_TABLET | Freq: Once | ORAL | Status: DC
  Filled 2021-09-15: qty 0.5

## 2021-09-15 MED ORDER — CELECOXIB 200 MG PO CAPS: 200.0000 mg | ORAL_CAPSULE | Freq: Once | ORAL | Status: AC

## 2021-09-15 MED ORDER — ORAL CARE MOUTH RINSE: 15.0000 mL | Freq: Once | OROMUCOSAL | Status: AC

## 2021-09-15 MED ORDER — ACETAMINOPHEN 500 MG PO TABS: 1000.0000 mg | ORAL_TABLET | Freq: Once | ORAL | Status: AC

## 2021-09-15 MED ORDER — CEFAZOLIN SODIUM-DEXTROSE 2-4 GM/100ML-% IV SOLN
2.0000 g | INTRAVENOUS | Status: AC
  Administered 2021-09-16: 2 g via INTRAVENOUS

## 2021-09-15 NOTE — Anesthesia Preprocedure Evaluation (Signed)
Anesthesia Evaluation  Patient identified by MRN, date of birth, ID band Patient awake    Reviewed: Allergy & Precautions, NPO status , Patient's Chart, lab work & pertinent test results  Airway Mallampati: III  TM Distance: >3 FB Neck ROM: full    Dental no notable dental hx.    Pulmonary  scarring to left lung due to repeat pneumonia in her younger years   Pulmonary exam normal        Cardiovascular Exercise Tolerance: Poor Normal cardiovascular exam     Neuro/Psych  Headaches, PSYCHIATRIC DISORDERS Bipolar Disorder    GI/Hepatic negative GI ROS, Neg liver ROS,   Endo/Other  negative endocrine ROS  Renal/GU      Musculoskeletal  (+) Arthritis ,   Abdominal Normal abdominal exam  (+)   Peds  Hematology negative hematology ROS (+)   Anesthesia Other Findings Past Medical History: No date: Agoraphobia No date: Arthritis No date: Bipolar 1 disorder ([facility]) No date: Cancer ([place])     Comment:  skin cancer No date: Depression No date: Injury of lung     Comment:  scarring of left lung due to pneumonia No date: Panic attacks No date: Pneumonia     Comment:  scarring to left lung due to repeat pneumonia in her               younger years No date: PTSD (post-traumatic stress disorder)  Past Surgical History: No date: ABDOMINAL HYSTERECTOMY No date: COLONOSCOPY No date: REPLACEMENT TOTAL KNEE; Bilateral No date: TOE SURGERY; Right     Comment:  half of great toe amputated as a child due to a               lawnmower accident     Reproductive/Obstetrics negative OB ROS                            Anesthesia Physical Anesthesia Plan  ASA: 2  Anesthesia Plan: General   Post-op Pain Management: Tylenol PO (pre-op)*, Celebrex PO (pre-op)*, Gabapentin PO (pre-op)* and Regional block*   Induction: Intravenous  PONV Risk Score and Plan: 3 and Ondansetron, Dexamethasone and  Midazolam  Airway Management Planned: LMA  Additional Equipment:   Intra-op Plan:   Post-operative Plan: Extubation in OR  Informed Consent: I have reviewed the patients History and Physical, chart, labs and discussed the procedure including the risks, benefits and alternatives for the proposed anesthesia with the patient or authorized representative who has indicated his/her understanding and acceptance.     Dental Advisory Given  Plan Discussed with: Anesthesiologist, CRNA and Surgeon  Anesthesia Plan Comments:       Anesthesia Quick Evaluation

## 2021-09-16 ENCOUNTER — Ambulatory Visit: Payer: Medicare PPO | Admitting: Anesthesiology

## 2021-09-16 ENCOUNTER — Ambulatory Visit: Payer: Medicare PPO

## 2021-09-16 ENCOUNTER — Encounter: Payer: Self-pay | Admitting: Orthopedic Surgery

## 2021-09-16 ENCOUNTER — Ambulatory Visit
Admission: RE | Admit: 2021-09-16 | Discharge: 2021-09-16 | Disposition: A | Discharge status: Home / Self Care | Payer: Medicare PPO | Attending: Orthopedic Surgery | Admitting: Orthopedic Surgery

## 2021-09-16 ENCOUNTER — Encounter
Admission: RE | Disposition: A | Discharge status: Home / Self Care | Payer: Self-pay | Source: Home / Self Care | Attending: Orthopedic Surgery

## 2021-09-16 ENCOUNTER — Ambulatory Visit: Payer: Medicare PPO | Admitting: Urgent Care

## 2021-09-16 ENCOUNTER — Other Ambulatory Visit: Payer: Self-pay

## 2021-09-16 DIAGNOSIS — S82001A Unspecified fracture of right patella, initial encounter for closed fracture: Secondary | ICD-10-CM | POA: Diagnosis present

## 2021-09-16 DIAGNOSIS — X58XXXA Exposure to other specified factors, initial encounter: Secondary | ICD-10-CM | POA: Insufficient documentation

## 2021-09-16 DIAGNOSIS — Z96653 Presence of artificial knee joint, bilateral: Secondary | ICD-10-CM | POA: Diagnosis not present

## 2021-09-16 HISTORY — PX: ORIF PATELLA: SHX5033

## 2021-09-16 LAB — URINE DRUG SCREEN, QUALITATIVE (ARMC ONLY)
Amphetamines, Ur Screen: NOT DETECTED
Barbiturates, Ur Screen: NOT DETECTED
Benzodiazepine, Ur Scrn: POSITIVE — AB
Cannabinoid 50 Ng, Ur ~~LOC~~: POSITIVE — AB
Cocaine Metabolite,Ur ~~LOC~~: NOT DETECTED
MDMA (Ecstasy)Ur Screen: NOT DETECTED
Methadone Scn, Ur: NOT DETECTED
Opiate, Ur Screen: POSITIVE — AB
Phencyclidine (PCP) Ur S: NOT DETECTED
Tricyclic, Ur Screen: NOT DETECTED

## 2021-09-16 SURGERY — OPEN REDUCTION INTERNAL FIXATION (ORIF) PATELLA
Anesthesia: General | Site: Knee | Laterality: Right

## 2021-09-16 MED ORDER — FENTANYL CITRATE (PF) 100 MCG/2ML IJ SOLN
INTRAMUSCULAR | Status: DC | PRN
  Administered 2021-09-16 (×3): 50 ug via INTRAVENOUS

## 2021-09-16 MED ORDER — DEXMEDETOMIDINE (PRECEDEX) IN NS 20 MCG/5ML (4 MCG/ML) IV SYRINGE
PREFILLED_SYRINGE | INTRAVENOUS | Status: DC | PRN
  Administered 2021-09-16: 4 ug via INTRAVENOUS
  Administered 2021-09-16 (×2): 8 ug via INTRAVENOUS

## 2021-09-16 MED ORDER — EPHEDRINE SULFATE (PRESSORS) 50 MG/ML IJ SOLN
INTRAMUSCULAR | Status: DC | PRN
  Administered 2021-09-16: 10 mg via INTRAVENOUS
  Administered 2021-09-16: 5 mg via INTRAVENOUS

## 2021-09-16 MED ORDER — MIDAZOLAM HCL 2 MG/2ML IJ SOLN
INTRAMUSCULAR | Status: DC | PRN
  Administered 2021-09-16: 2 mg via INTRAVENOUS

## 2021-09-16 MED ORDER — FENTANYL CITRATE (PF) 100 MCG/2ML IJ SOLN
INTRAMUSCULAR | Status: AC
  Filled 2021-09-16: qty 2

## 2021-09-16 MED ORDER — OXYCODONE HCL 5 MG PO TABS
ORAL_TABLET | ORAL | Status: AC
  Filled 2021-09-16: qty 1

## 2021-09-16 MED ORDER — LIDOCAINE HCL (PF) 1 % IJ SOLN
INTRAMUSCULAR | Status: DC | PRN
  Administered 2021-09-16: 1 mL via SUBCUTANEOUS

## 2021-09-16 MED ORDER — FENTANYL CITRATE (PF) 100 MCG/2ML IJ SOLN
INTRAMUSCULAR | Status: AC
  Administered 2021-09-16: 25 ug via INTRAVENOUS
  Filled 2021-09-16: qty 2

## 2021-09-16 MED ORDER — ACETAMINOPHEN 10 MG/ML IV SOLN: 1000.0000 mg | Freq: Once | INTRAVENOUS | Status: DC | PRN

## 2021-09-16 MED ORDER — ACETAMINOPHEN 325 MG PO TABS: 325.0000 mg | ORAL_TABLET | Freq: Four times a day (QID) | ORAL | Status: DC | PRN

## 2021-09-16 MED ORDER — OXYCODONE HCL 5 MG PO TABS
5.0000 mg | ORAL_TABLET | Freq: Once | ORAL | Status: AC | PRN
  Administered 2021-09-16: 5 mg via ORAL

## 2021-09-16 MED ORDER — METOCLOPRAMIDE HCL 5 MG/ML IJ SOLN: 5.0000 mg | Freq: Three times a day (TID) | INTRAMUSCULAR | Status: DC | PRN

## 2021-09-16 MED ORDER — CEFAZOLIN SODIUM-DEXTROSE 2-4 GM/100ML-% IV SOLN
INTRAVENOUS | Status: AC
  Filled 2021-09-16: qty 100

## 2021-09-16 MED ORDER — HYDROCODONE-ACETAMINOPHEN 5-325 MG PO TABS: 1.0000 | ORAL_TABLET | ORAL | Status: DC | PRN

## 2021-09-16 MED ORDER — PROPOFOL 10 MG/ML IV BOLUS
INTRAVENOUS | Status: AC
  Filled 2021-09-16: qty 20

## 2021-09-16 MED ORDER — GABAPENTIN 300 MG PO CAPS: 300.0000 mg | ORAL_CAPSULE | Freq: Once | ORAL | Status: AC

## 2021-09-16 MED ORDER — MORPHINE SULFATE (PF) 2 MG/ML IV SOLN: 0.5000 mg | INTRAVENOUS | Status: DC | PRN

## 2021-09-16 MED ORDER — BUPIVACAINE-EPINEPHRINE (PF) 0.25% -1:200000 IJ SOLN
INTRAMUSCULAR | Status: DC | PRN
  Administered 2021-09-16: 10 mL

## 2021-09-16 MED ORDER — DOCUSATE SODIUM 100 MG PO CAPS: 100.0000 mg | ORAL_CAPSULE | Freq: Every day | ORAL | 2 refills | Status: AC | PRN

## 2021-09-16 MED ORDER — DEXAMETHASONE SODIUM PHOSPHATE 10 MG/ML IJ SOLN
INTRAMUSCULAR | Status: AC
  Filled 2021-09-16: qty 1

## 2021-09-16 MED ORDER — LIDOCAINE HCL (CARDIAC) PF 100 MG/5ML IV SOSY
PREFILLED_SYRINGE | INTRAVENOUS | Status: DC | PRN
  Administered 2021-09-16: 80 mg via INTRAVENOUS

## 2021-09-16 MED ORDER — ONDANSETRON HCL 4 MG PO TABS: 4.0000 mg | ORAL_TABLET | Freq: Four times a day (QID) | ORAL | Status: DC | PRN

## 2021-09-16 MED ORDER — ONDANSETRON HCL 4 MG/2ML IJ SOLN
INTRAMUSCULAR | Status: DC | PRN
  Administered 2021-09-16: 4 mg via INTRAVENOUS

## 2021-09-16 MED ORDER — PROPOFOL 10 MG/ML IV BOLUS
INTRAVENOUS | Status: DC | PRN
  Administered 2021-09-16: 160 mg via INTRAVENOUS

## 2021-09-16 MED ORDER — HYDROCODONE-ACETAMINOPHEN 5-325 MG PO TABS: 1.0000 | ORAL_TABLET | ORAL | 0 refills | Status: DC | PRN

## 2021-09-16 MED ORDER — FENTANYL CITRATE (PF) 100 MCG/2ML IJ SOLN
25.0000 ug | INTRAMUSCULAR | Status: DC | PRN
  Administered 2021-09-16: 25 ug via INTRAVENOUS

## 2021-09-16 MED ORDER — METOCLOPRAMIDE HCL 10 MG PO TABS: 5.0000 mg | ORAL_TABLET | Freq: Three times a day (TID) | ORAL | Status: DC | PRN

## 2021-09-16 MED ORDER — MIDAZOLAM HCL 2 MG/2ML IJ SOLN
INTRAMUSCULAR | Status: AC
  Filled 2021-09-16: qty 2

## 2021-09-16 MED ORDER — BUPIVACAINE HCL (PF) 0.5 % IJ SOLN
INTRAMUSCULAR | Status: AC
  Filled 2021-09-16: qty 10

## 2021-09-16 MED ORDER — PROMETHAZINE HCL 25 MG/ML IJ SOLN: 6.2500 mg | INTRAMUSCULAR | Status: DC | PRN

## 2021-09-16 MED ORDER — CELECOXIB 200 MG PO CAPS
ORAL_CAPSULE | ORAL | Status: AC
  Administered 2021-09-16: 200 mg via ORAL
  Filled 2021-09-16: qty 1

## 2021-09-16 MED ORDER — BUPIVACAINE-EPINEPHRINE (PF) 0.25% -1:200000 IJ SOLN
INTRAMUSCULAR | Status: AC
Start: 2021-09-16 — End: ?
  Filled 2021-09-16: qty 30

## 2021-09-16 MED ORDER — FAMOTIDINE 20 MG PO TABS
ORAL_TABLET | ORAL | Status: AC
  Administered 2021-09-16: 20 mg via ORAL
  Filled 2021-09-16: qty 1

## 2021-09-16 MED ORDER — CHLORHEXIDINE GLUCONATE 0.12 % MT SOLN
OROMUCOSAL | Status: AC
  Administered 2021-09-16: 15 mL via OROMUCOSAL
  Filled 2021-09-16: qty 15

## 2021-09-16 MED ORDER — SURGIRINSE WOUND IRRIGATION SYSTEM - OPTIME
TOPICAL | Status: DC | PRN
  Administered 2021-09-16: 900 mL

## 2021-09-16 MED ORDER — ONDANSETRON HCL 4 MG/2ML IJ SOLN: 4.0000 mg | Freq: Four times a day (QID) | INTRAMUSCULAR | Status: DC | PRN

## 2021-09-16 MED ORDER — ONDANSETRON HCL 4 MG/2ML IJ SOLN
INTRAMUSCULAR | Status: AC
  Filled 2021-09-16: qty 2

## 2021-09-16 MED ORDER — GABAPENTIN 300 MG PO CAPS
ORAL_CAPSULE | ORAL | Status: AC
  Administered 2021-09-16: 300 mg
  Filled 2021-09-16: qty 1

## 2021-09-16 MED ORDER — LIDOCAINE HCL (PF) 2 % IJ SOLN
INTRAMUSCULAR | Status: AC
  Filled 2021-09-16: qty 5

## 2021-09-16 MED ORDER — 0.9 % SODIUM CHLORIDE (POUR BTL) OPTIME
TOPICAL | Status: DC | PRN
  Administered 2021-09-16: 1000 mL

## 2021-09-16 MED ORDER — OXYCODONE HCL 5 MG/5ML PO SOLN: 5.0000 mg | Freq: Once | ORAL | Status: AC | PRN

## 2021-09-16 MED ORDER — KETOROLAC TROMETHAMINE 15 MG/ML IJ SOLN: 7.5000 mg | Freq: Four times a day (QID) | INTRAMUSCULAR | Status: DC

## 2021-09-16 MED ORDER — HYDROCODONE-ACETAMINOPHEN 7.5-325 MG PO TABS: 1.0000 | ORAL_TABLET | ORAL | Status: DC | PRN

## 2021-09-16 MED ORDER — LIDOCAINE HCL (PF) 1 % IJ SOLN
INTRAMUSCULAR | Status: AC
  Filled 2021-09-16: qty 5

## 2021-09-16 MED ORDER — ROCURONIUM BROMIDE 10 MG/ML (PF) SYRINGE
PREFILLED_SYRINGE | INTRAVENOUS | Status: AC
  Filled 2021-09-16: qty 10

## 2021-09-16 MED ORDER — BUPIVACAINE HCL (PF) 0.5 % IJ SOLN
INTRAMUSCULAR | Status: DC | PRN
  Administered 2021-09-16: 20 mL via PERINEURAL

## 2021-09-16 MED ORDER — ACETAMINOPHEN 500 MG PO TABS
ORAL_TABLET | ORAL | Status: AC
  Administered 2021-09-16: 1000 mg via ORAL
  Filled 2021-09-16: qty 2

## 2021-09-16 SURGICAL SUPPLY — 53 items
APL PRP STRL LF DISP 70% ISPRP (MISCELLANEOUS) ×1
BNDG COHESIVE 4X5 TAN ST LF (GAUZE/BANDAGES/DRESSINGS) ×3 IMPLANT
BNDG ESMARK 6X12 TAN STRL LF (GAUZE/BANDAGES/DRESSINGS) ×3 IMPLANT
CHLORAPREP W/TINT 26 (MISCELLANEOUS) ×3 IMPLANT
COOLER POLAR GLACIER W/PUMP (MISCELLANEOUS) ×3 IMPLANT
CUFF TOURN SGL QUICK 24 (TOURNIQUET CUFF)
CUFF TOURN SGL QUICK 34 (TOURNIQUET CUFF)
CUFF TRNQT CYL 24X4X16.5-23 (TOURNIQUET CUFF) IMPLANT
CUFF TRNQT CYL 34X4.125X (TOURNIQUET CUFF) IMPLANT
DRAPE C-ARM XRAY 36X54 (DRAPES) ×3 IMPLANT
DRAPE SHEET LG 3/4 BI-LAMINATE (DRAPES) ×1 IMPLANT
ELECT REM PT RETURN 9FT ADLT (ELECTROSURGICAL) ×2
ELECTRODE REM PT RTRN 9FT ADLT (ELECTROSURGICAL) ×2 IMPLANT
GAUZE SPONGE 4X4 12PLY STRL (GAUZE/BANDAGES/DRESSINGS) ×3 IMPLANT
GAUZE XEROFORM 1X8 LF (GAUZE/BANDAGES/DRESSINGS) ×3 IMPLANT
GLOVE BIOGEL PI IND STRL 8.5 (GLOVE) ×2 IMPLANT
GLOVE BIOGEL PI INDICATOR 8.5 (GLOVE) ×1
GLOVE SURG ORTHO 8.5 STRL (GLOVE) ×6 IMPLANT
GOWN STRL REUS W/ TWL LRG LVL3 (GOWN DISPOSABLE) ×4 IMPLANT
GOWN STRL REUS W/TWL LRG LVL3 (GOWN DISPOSABLE) ×4
IMMBOLIZER KNEE 19 BLUE UNIV (SOFTGOODS) ×1 IMPLANT
IMMOB KNEE 14 THIGH 24 706614 (SOFTGOODS) ×3 IMPLANT
KIT TURNOVER KIT A (KITS) ×3 IMPLANT
MANIFOLD NEPTUNE II (INSTRUMENTS) ×3 IMPLANT
NDL REVERSE CUT 1/2 CRC (NEEDLE) IMPLANT
NDL SAFETY ECLIPSE 18X1.5 (NEEDLE) ×2 IMPLANT
NEEDLE HYPO 18GX1.5 SHARP (NEEDLE) ×2
NEEDLE REVERSE CUT 1/2 CRC (NEEDLE) ×2 IMPLANT
NS IRRIG 1000ML POUR BTL (IV SOLUTION) ×3 IMPLANT
PACK EXTREMITY ARMC (MISCELLANEOUS) ×3 IMPLANT
PAD ABD DERMACEA PRESS 5X9 (GAUZE/BANDAGES/DRESSINGS) ×1 IMPLANT
PAD CAST 4YDX4 CTTN HI CHSV (CAST SUPPLIES) IMPLANT
PAD CAST CTTN 4X4 STRL (SOFTGOODS) ×2 IMPLANT
PAD WRAPON POLAR KNEE (MISCELLANEOUS) ×2 IMPLANT
PADDING CAST COTTON 4X4 STRL (CAST SUPPLIES) ×2
PADDING CAST COTTON 4X4 STRL (SOFTGOODS) ×2
PUTTY DBX 1CC (Putty) ×2 IMPLANT
PUTTY DBX 1CC DEPUY (Putty) IMPLANT
SOLUTION IRRIG SURGIPHOR (IV SOLUTION) ×1 IMPLANT
SPONGE T-LAP 18X18 ~~LOC~~+RFID (SPONGE) ×4 IMPLANT
STAPLER SKIN PROX 35W (STAPLE) ×3 IMPLANT
STOCKINETTE BIAS CUT 6 980064 (GAUZE/BANDAGES/DRESSINGS) ×3 IMPLANT
STOCKINETTE IMPERVIOUS 9X36 MD (GAUZE/BANDAGES/DRESSINGS) ×3 IMPLANT
SUT ETHIBOND #5 BRAIDED 30INL (SUTURE) ×1 IMPLANT
SUT VIC AB 0 CT1 27 (SUTURE) ×2
SUT VIC AB 0 CT1 27XCR 8 STRN (SUTURE) IMPLANT
SUT VIC AB 0 CT1 36 (SUTURE) ×1 IMPLANT
SUT VIC AB 2-0 CT1 (SUTURE) ×1 IMPLANT
SUT VIC AB 2-0 CT1 27 (SUTURE) ×4
SUT VIC AB 2-0 CT1 TAPERPNT 27 (SUTURE) IMPLANT
SYR 10ML LL (SYRINGE) ×3 IMPLANT
WATER STERILE IRR 500ML POUR (IV SOLUTION) ×3 IMPLANT
WRAPON POLAR PAD KNEE (MISCELLANEOUS) ×2

## 2021-09-16 NOTE — Anesthesia Procedure Notes (Signed)
Procedure Name: LMA Insertion Date/Time: 09/16/2021 11:24 AM  Performed by: Loletha Grayer, CRNAPre-anesthesia Checklist: Patient identified, Patient being monitored, Timeout performed, Emergency Drugs available and Suction available Patient Re-evaluated:Patient Re-evaluated prior to induction Oxygen Delivery Method: Circle system utilized Preoxygenation: Pre-oxygenation with 100% oxygen Induction Type: IV induction Ventilation: Mask ventilation without difficulty LMA: LMA inserted LMA Size: 4.0 Tube type: Oral Number of attempts: 1 Placement Confirmation: positive ETCO2 and breath sounds checked- equal and bilateral Tube secured with: Tape Dental Injury: Teeth and Oropharynx as per pre-operative assessment

## 2021-09-16 NOTE — Discharge Instructions (Addendum)
Knee immobilizer all times except hygiene purposes Weight bear as tolerated with knee immobilizer Follow-up as scheduled Call [phone] with any questions or concerns including fever or shortness of breath Keep dressing clean and dry   AMBULATORY SURGERY  DISCHARGE INSTRUCTIONS   The drugs that you were given will stay in your system until tomorrow so for the next 24 hours you should not:  Drive an automobile Make any legal decisions Drink any alcoholic beverage   You may resume regular meals tomorrow.  Today it is better to start with liquids and gradually work up to solid foods.  You may eat anything you prefer, but it is better to start with liquids, then soup and crackers, and gradually work up to solid foods.   Please notify your doctor immediately if you have any unusual bleeding, trouble breathing, redness and pain at the surgery site, drainage, fever, or pain not relieved by medication.    Additional Instructions:        Please contact your physician with any problems or Same Day Surgery at [phone], Monday through Friday 6 am to 4 pm, or [place] at [facility] number at [phone].

## 2021-09-16 NOTE — Op Note (Signed)
09/16/2021  1:21 PM  PATIENT:  Mindy Caldwell    PRE-OPERATIVE DIAGNOSIS:  S82.001A Unsp fracture of right patella, init for clos fx, history of total knee arthroplasty  POST-OPERATIVE DIAGNOSIS:  Same  PROCEDURE:  OPEN REDUCTION INTERNAL (ORIF) FIXATION PATELLA, RIGHT  SURGEON:  Lovell Sheehan, MD  ASSIST: Carlynn Spry, PA-C  TOURNIQUET TIME:  23  MIN at 250 mmHg  ANESTHESIA:   General  PREOPERATIVE INDICATIONS:  Mindy Caldwell is a  66 y.o. female with a diagnosis of S82.001A Unsp fracture of right patella, init for clos fx who failed conservative measures and elected for surgical management.    The risks benefits and alternatives were discussed with the patient preoperatively including but not limited to the risks of infection, bleeding, nerve injury, malunion, nonunion, wrist stiffness, persistent wrist pain, osteoarthritis and the need for further surgery. Medical risks include but are not limited to DVT and pulmonary embolism, myocardial infarction, stroke, pneumonia, respiratory failure and death. Patient  understood these risks and wished to proceed.    OPERATIVE PROCEDURE:   Patient brought to the operating room. Patient was placed supine on the operative table. General anesthesia was induced.  Patient had a tourniquet applied to the right upper thigh and prepped and draped in a sterile fashion. A timeout was performed to verify the patient's name, date of birth, medical record number, correct site of surgery correct procedure to be performed. Was also used to verify the patient received antibiotics appropriate instruments, implants and radiographic studies were available in the room. Once all in attendance were in agreement case began.  A vertical incision was made centered over the patella using the prior total knee incision. Full-thickness skin flaps were developed. The fracture was then identified. The retinaculum however was not torn. There was abundant scar and 2 cm  displacement of the inferior fracture fragment. The patellar button was identified and found to be well fixed. The scar tissue was debrided sharply with rongeurs. The fracture was irrigated. A small incision was made to the retinaculum to allow for palpation of the undersurface the patella. This allowed for anatomic alignment of the articular surface. The femoral component appeared to be in good condition. The joint was irrigated with betadine impregnated saline. Ethibond #5 was placed along the infrapatellar ligament in a locking running fashion. A total of 4 sutures were placed. Three drill holes were placed through the superior fragment of the patella. Using a suture passer the 4 limbs were brought through. BMP was injected along the fresh bone edges. A large fracture clamp was then used to hold the fracture reduction. The fracture reduction was then confirmed on AP and lateral C-arm imaging. The sutures were tied along the superior aspect over the bone.   The retinaculum medially was repaired with #1 Vicryl. The figure-of-eight construct was covered with the soft tissue envelope closed with 1 Vicryl. Subcutaneous tissue was closed with an interrupted 2-0 Vicryl and the skin was approximated with staples. The patient was placed in a knee immobilizer. All sharp and instrument counts were correct at the conclusion the case. I was scrubbed and present the entire case. I spoke with the patient's family in the postop consultation room at the conclusion the case with them know the patient was stable in the recovery room in the case had been performed without complication.  Mindy Bushman, MD

## 2021-09-16 NOTE — Anesthesia Procedure Notes (Signed)
Anesthesia Regional Block: Femoral nerve block   Pre-Anesthetic Checklist: , timeout performed,  Correct Patient, Correct Site, Correct Laterality,  Correct Procedure, Correct Position, site marked,  Risks and benefits discussed,  Surgical consent,  Pre-op evaluation,  At surgeon's request and post-op pain management  Laterality: Right  Prep: chloraprep       Needles:  Injection technique: Single-shot  Needle Type: Stimiplex     Needle Length: 9cm  Needle Gauge: 22     Additional Needles:   Procedures:,,,, ultrasound used (permanent image in chart),,    Narrative:  Start time: 09/16/2021 11:08 AM End time: 09/16/2021 11:11 AM Injection made incrementally with aspirations every 20 mL.  Performed by: Personally  Anesthesiologist: Iran Ouch, MD  Additional Notes: Patient consented for risk and benefits of nerve block including but not limited to nerve damage, failed block, bleeding and infection.  Patient voiced understanding.  Functioning IV was confirmed and monitors were applied.  Timeout done prior to procedure and prior to any sedation being given to the patient.  Patient confirmed procedure site prior to any sedation given to the patient.  A 25m 22ga Stimuplex needle was used. Sterile prep,hand hygiene and sterile gloves were used.  Minimal sedation used for procedure.  No paresthesia endorsed by patient during the procedure.  Negative aspiration and negative test dose prior to incremental administration of local anesthetic. The patient tolerated the procedure well with no immediate complications.

## 2021-09-16 NOTE — Transfer of Care (Signed)
Immediate Anesthesia Transfer of Care Note  Patient: Mindy Caldwell  Procedure(s) Performed: OPEN REDUCTION INTERNAL (ORIF) FIXATION PATELLA (Right: Knee)  Patient Location: PACU  Anesthesia Type:General  Level of Consciousness: drowsy  Airway & Oxygen Therapy: Patient Spontanous Breathing and Patient connected to face mask oxygen  Post-op Assessment: Report given to RN and Post -op Vital signs reviewed and stable  Post vital signs: Reviewed and stable  Last Vitals:  Vitals Value Taken Time  BP 119/90 09/16/21 1324  Temp    Pulse 59 09/16/21 1328  Resp 10 09/16/21 1328  SpO2 97 % 09/16/21 1328  Vitals shown include unvalidated device data.  Last Pain:  Vitals:   09/16/21 1021  TempSrc: Temporal  PainSc: 4          Complications: No notable events documented.

## 2021-09-16 NOTE — H&P (Signed)
The patient has been re-examined, and the chart reviewed, and there have been no interval changes to the documented history and physical.  Plan a right patella repair today.  Anesthesia is consulted regarding a peripheral nerve block for post-operative pain.  The risks, benefits, and alternatives have been discussed at length, and the patient is willing to proceed.

## 2021-09-17 NOTE — Anesthesia Postprocedure Evaluation (Signed)
Anesthesia Post Note  Patient: Mindy Caldwell  Procedure(s) Performed: OPEN REDUCTION INTERNAL (ORIF) FIXATION PATELLA (Right: Knee)  Patient location during evaluation: PACU Anesthesia Type: General Level of consciousness: awake Pain management: pain level controlled Vital Signs Assessment: post-procedure vital signs reviewed and stable Respiratory status: spontaneous breathing, nonlabored ventilation and respiratory function stable Cardiovascular status: blood pressure returned to baseline and stable Postop Assessment: no apparent nausea or vomiting Anesthetic complications: no   No notable events documented.   Last Vitals:  Vitals:   09/16/21 1445 09/16/21 1459  BP: 115/82 132/86  Pulse: 76 68  Resp: 18 18  Temp:  (!) 36.2 C  SpO2: 97% 100%    Last Pain:  Vitals:   09/16/21 1459  TempSrc: Temporal  PainSc: 0-No pain                 Iran Ouch

## 2021-09-18 ENCOUNTER — Other Ambulatory Visit: Payer: Self-pay | Admitting: Orthopedic Surgery

## 2021-09-18 ENCOUNTER — Encounter: Payer: Self-pay | Admitting: Orthopedic Surgery

## 2021-09-18 NOTE — H&P (Deleted)
  The note originally documented on this encounter has been moved the the encounter in which it belongs.  

## 2021-09-18 NOTE — H&P (Signed)
Mindy Caldwell MRN:  [id number] DOB/SEX:  10/08/1955/female  CHIEF COMPLAINT:  Painful right Knee  HISTORY: Patient is a 66 y.o. female presented with a history of pain in the right knee. Onset of symptoms was abrupt starting several days ago with gradually worsening course since that time. Prior procedures on the knee include arthroplasty. Patient has been treated conservatively with over-the-counter NSAIDs and activity modification. Patient currently rates pain in the knee at 10 out of 10 with activity. There is pain at night.  PAST MEDICAL HISTORY: Patient Active Problem List   Diagnosis Date Noted   Migraines 07/19/2016   Hypertension 07/19/2016   Past Medical History:  Diagnosis Date   Agoraphobia    Arthritis    Bipolar 1 disorder ([place])    Cancer ([place])    skin cancer   Depression    Injury of lung    scarring of left lung due to pneumonia   Panic attacks    Pneumonia    scarring to left lung due to repeat pneumonia in her younger years   PTSD (post-traumatic stress disorder)    Past Surgical History:  Procedure Laterality Date   ABDOMINAL HYSTERECTOMY     COLONOSCOPY     ORIF PATELLA Right 09/16/2021   Procedure: OPEN REDUCTION INTERNAL (ORIF) FIXATION PATELLA;  Surgeon: Lovell Sheehan, MD;  Location: [facility] ORS;  Service: Orthopedics;  Laterality: Right;   REPLACEMENT TOTAL KNEE Bilateral    TOE SURGERY Right    half of great toe amputated as a child due to a lawnmower accident     MEDICATIONS:  (Not in a hospital admission)   ALLERGIES:   Allergies  Allergen Reactions   Mobic [Meloxicam] Nausea Only   Oxybutynin Chloride Other (See Comments)    Made symptoms worse   Prednisone Nausea And Vomiting    REVIEW OF SYSTEMS:  Pertinent items are noted in HPI.   FAMILY HISTORY:   Family History  Problem Relation Age of Onset   Lung cancer Mother    Bone cancer Father    Cervical cancer Sister    Diabetes Sister     SOCIAL HISTORY:   Social History    Tobacco Use   Smoking status: Never   Smokeless tobacco: Never  Substance Use Topics   Alcohol use: No     EXAMINATION:  Vital signs in last 24 hours: '@VSRANGES'$ @  General appearance: alert, cooperative, and no distress Neck: no JVD and supple, symmetrical, trachea midline Lungs: clear to auscultation bilaterally Heart: regular rate and rhythm, S1, S2 normal, no murmur, click, rub or gallop Abdomen: soft, non-tender; bowel sounds normal; no masses,  no organomegaly Extremities: extremities normal, atraumatic, no cyanosis or edema and Homans sign is negative, no sign of DVT Pulses: 2+ and symmetric Skin: Skin color, texture, turgor normal. No rashes or lesions  Musculoskeletal:  ROM 0-0, Ligaments intact,  Imaging Review Plain radiographs demonstrate Fractured patella  Right knee patella fracture   Right knee open reduction and internal fixation  Assessment/Plan: Carlynn Spry 09/18/2021, 2:06 PM

## 2021-09-20 MED ORDER — LACTATED RINGERS IV SOLN: INTRAVENOUS | Status: DC

## 2021-09-20 MED ORDER — CEFAZOLIN SODIUM-DEXTROSE 2-4 GM/100ML-% IV SOLN
2.0000 g | INTRAVENOUS | Status: AC
  Administered 2021-09-21: 2 g via INTRAVENOUS

## 2021-09-21 ENCOUNTER — Ambulatory Visit: Payer: Medicare PPO | Admitting: Certified Registered"

## 2021-09-21 ENCOUNTER — Encounter: Payer: Self-pay | Admitting: Orthopedic Surgery

## 2021-09-21 ENCOUNTER — Other Ambulatory Visit: Payer: Self-pay

## 2021-09-21 ENCOUNTER — Encounter
Admission: RE | Disposition: A | Discharge status: Home / Self Care | Payer: Self-pay | Source: Home / Self Care | Attending: Orthopedic Surgery

## 2021-09-21 ENCOUNTER — Observation Stay
Admission: RE | Admit: 2021-09-21 | Discharge: 2021-09-23 | Disposition: A | Discharge status: Home / Self Care | Payer: Medicare PPO | Attending: Orthopedic Surgery | Admitting: Orthopedic Surgery

## 2021-09-21 ENCOUNTER — Ambulatory Visit: Payer: Medicare PPO

## 2021-09-21 DIAGNOSIS — W19XXXA Unspecified fall, initial encounter: Secondary | ICD-10-CM | POA: Insufficient documentation

## 2021-09-21 DIAGNOSIS — Z85828 Personal history of other malignant neoplasm of skin: Secondary | ICD-10-CM | POA: Insufficient documentation

## 2021-09-21 DIAGNOSIS — S82031A Displaced transverse fracture of right patella, initial encounter for closed fracture: Secondary | ICD-10-CM | POA: Diagnosis present

## 2021-09-21 DIAGNOSIS — S82001A Unspecified fracture of right patella, initial encounter for closed fracture: Secondary | ICD-10-CM | POA: Diagnosis present

## 2021-09-21 DIAGNOSIS — I10 Essential (primary) hypertension: Principal | ICD-10-CM

## 2021-09-21 DIAGNOSIS — Y92009 Unspecified place in unspecified non-institutional (private) residence as the place of occurrence of the external cause: Secondary | ICD-10-CM | POA: Diagnosis not present

## 2021-09-21 DIAGNOSIS — R2689 Other abnormalities of gait and mobility: Secondary | ICD-10-CM | POA: Diagnosis not present

## 2021-09-21 DIAGNOSIS — M6281 Muscle weakness (generalized): Secondary | ICD-10-CM | POA: Insufficient documentation

## 2021-09-21 HISTORY — PX: ORIF PATELLA: SHX5033

## 2021-09-21 SURGERY — OPEN REDUCTION INTERNAL FIXATION (ORIF) PATELLA
Anesthesia: General | Site: Knee | Laterality: Right

## 2021-09-21 MED ORDER — LIDOCAINE HCL (CARDIAC) PF 100 MG/5ML IV SOSY
PREFILLED_SYRINGE | INTRAVENOUS | Status: DC | PRN
  Administered 2021-09-21: 100 mg via INTRAVENOUS

## 2021-09-21 MED ORDER — BUPIVACAINE-EPINEPHRINE (PF) 0.25% -1:200000 IJ SOLN
INTRAMUSCULAR | Status: AC
  Filled 2021-09-21: qty 30

## 2021-09-21 MED ORDER — TIZANIDINE HCL 4 MG PO TABS
4.0000 mg | ORAL_TABLET | Freq: Three times a day (TID) | ORAL | Status: DC | PRN
  Administered 2021-09-21 – 2021-09-22 (×2): 4 mg via ORAL
  Filled 2021-09-21 (×3): qty 1

## 2021-09-21 MED ORDER — CEFAZOLIN SODIUM-DEXTROSE 2-4 GM/100ML-% IV SOLN
2.0000 g | Freq: Three times a day (TID) | INTRAVENOUS | Status: AC
  Administered 2021-09-21 – 2021-09-22 (×2): 2 g via INTRAVENOUS
  Filled 2021-09-21 (×2): qty 100

## 2021-09-21 MED ORDER — MIDAZOLAM HCL 2 MG/2ML IJ SOLN
INTRAMUSCULAR | Status: DC | PRN
  Administered 2021-09-21: 2 mg via INTRAVENOUS

## 2021-09-21 MED ORDER — ONDANSETRON HCL 4 MG PO TABS: 4.0000 mg | ORAL_TABLET | Freq: Four times a day (QID) | ORAL | Status: DC | PRN

## 2021-09-21 MED ORDER — MIDAZOLAM HCL 2 MG/2ML IJ SOLN
INTRAMUSCULAR | Status: AC
  Filled 2021-09-21: qty 2

## 2021-09-21 MED ORDER — ZIPRASIDONE HCL 80 MG PO CAPS
160.0000 mg | ORAL_CAPSULE | Freq: Every evening | ORAL | Status: DC
  Administered 2021-09-21 – 2021-09-22 (×2): 160 mg via ORAL
  Filled 2021-09-21 (×4): qty 2

## 2021-09-21 MED ORDER — DEXMEDETOMIDINE HCL IN NACL 200 MCG/50ML IV SOLN
INTRAVENOUS | Status: DC | PRN
  Administered 2021-09-21 (×2): 4 ug via INTRAVENOUS
  Administered 2021-09-21: 8 ug via INTRAVENOUS
  Administered 2021-09-21: 4 ug via INTRAVENOUS

## 2021-09-21 MED ORDER — ONDANSETRON HCL 4 MG/2ML IJ SOLN: 4.0000 mg | Freq: Once | INTRAMUSCULAR | Status: DC | PRN

## 2021-09-21 MED ORDER — DOCUSATE SODIUM 100 MG PO CAPS
100.0000 mg | ORAL_CAPSULE | Freq: Two times a day (BID) | ORAL | Status: DC
  Administered 2021-09-21 – 2021-09-23 (×4): 100 mg via ORAL
  Filled 2021-09-21 (×4): qty 1

## 2021-09-21 MED ORDER — EPHEDRINE SULFATE (PRESSORS) 50 MG/ML IJ SOLN
INTRAMUSCULAR | Status: DC | PRN
  Administered 2021-09-21 (×2): 5 mg via INTRAVENOUS

## 2021-09-21 MED ORDER — KETOCONAZOLE 2 % EX SHAM: 1.0000 | MEDICATED_SHAMPOO | CUTANEOUS | Status: DC | PRN

## 2021-09-21 MED ORDER — HYDROCODONE-ACETAMINOPHEN 7.5-325 MG PO TABS
1.0000 | ORAL_TABLET | ORAL | Status: DC | PRN
  Administered 2021-09-22 (×2): 1 via ORAL
  Filled 2021-09-21 (×3): qty 1

## 2021-09-21 MED ORDER — 0.9 % SODIUM CHLORIDE (POUR BTL) OPTIME
TOPICAL | Status: DC | PRN
  Administered 2021-09-21: 1000 mL

## 2021-09-21 MED ORDER — FENTANYL CITRATE (PF) 100 MCG/2ML IJ SOLN
INTRAMUSCULAR | Status: AC
  Filled 2021-09-21: qty 2

## 2021-09-21 MED ORDER — CEFAZOLIN SODIUM-DEXTROSE 2-4 GM/100ML-% IV SOLN
INTRAVENOUS | Status: AC
  Filled 2021-09-21: qty 100

## 2021-09-21 MED ORDER — ACETAMINOPHEN 325 MG PO TABS: 325.0000 mg | ORAL_TABLET | Freq: Four times a day (QID) | ORAL | Status: DC | PRN

## 2021-09-21 MED ORDER — HYDROCODONE-ACETAMINOPHEN 5-325 MG PO TABS: 1.0000 | ORAL_TABLET | ORAL | Status: DC | PRN

## 2021-09-21 MED ORDER — FENTANYL CITRATE (PF) 100 MCG/2ML IJ SOLN
25.0000 ug | INTRAMUSCULAR | Status: DC | PRN
  Administered 2021-09-21 (×4): 25 ug via INTRAVENOUS

## 2021-09-21 MED ORDER — METOCLOPRAMIDE HCL 5 MG/ML IJ SOLN: 5.0000 mg | Freq: Three times a day (TID) | INTRAMUSCULAR | Status: DC | PRN

## 2021-09-21 MED ORDER — ACETAMINOPHEN 10 MG/ML IV SOLN
INTRAVENOUS | Status: DC | PRN
  Administered 2021-09-21: 1000 mg via INTRAVENOUS

## 2021-09-21 MED ORDER — MORPHINE SULFATE (PF) 2 MG/ML IV SOLN: 0.5000 mg | INTRAVENOUS | Status: DC | PRN

## 2021-09-21 MED ORDER — KETOROLAC TROMETHAMINE 15 MG/ML IJ SOLN
INTRAMUSCULAR | Status: AC
  Filled 2021-09-21: qty 1

## 2021-09-21 MED ORDER — ALPRAZOLAM 1 MG PO TABS
1.0000 mg | ORAL_TABLET | Freq: Two times a day (BID) | ORAL | Status: DC
  Administered 2021-09-21 – 2021-09-23 (×4): 1 mg via ORAL
  Filled 2021-09-21 (×4): qty 1

## 2021-09-21 MED ORDER — PROPOFOL 10 MG/ML IV BOLUS
INTRAVENOUS | Status: DC | PRN
  Administered 2021-09-21: 150 mg via INTRAVENOUS
  Administered 2021-09-21: 50 mg via INTRAVENOUS

## 2021-09-21 MED ORDER — ACETAMINOPHEN 10 MG/ML IV SOLN
INTRAVENOUS | Status: AC
  Filled 2021-09-21: qty 100

## 2021-09-21 MED ORDER — KETOROLAC TROMETHAMINE 15 MG/ML IJ SOLN
7.5000 mg | Freq: Four times a day (QID) | INTRAMUSCULAR | Status: AC
  Administered 2021-09-21 – 2021-09-22 (×4): 7.5 mg via INTRAVENOUS
  Filled 2021-09-21 (×4): qty 1

## 2021-09-21 MED ORDER — PROPOFOL 10 MG/ML IV BOLUS
INTRAVENOUS | Status: AC
  Filled 2021-09-21: qty 20

## 2021-09-21 MED ORDER — BUPIVACAINE-EPINEPHRINE (PF) 0.25% -1:200000 IJ SOLN
INTRAMUSCULAR | Status: DC | PRN
  Administered 2021-09-21: 30 mL via PERINEURAL

## 2021-09-21 MED ORDER — LIDOCAINE HCL (PF) 2 % IJ SOLN
INTRAMUSCULAR | Status: AC
  Filled 2021-09-21: qty 5

## 2021-09-21 MED ORDER — ONDANSETRON HCL 4 MG/2ML IJ SOLN: 4.0000 mg | Freq: Four times a day (QID) | INTRAMUSCULAR | Status: DC | PRN

## 2021-09-21 MED ORDER — DEXMEDETOMIDINE HCL IN NACL 80 MCG/20ML IV SOLN
INTRAVENOUS | Status: AC
  Filled 2021-09-21: qty 20

## 2021-09-21 MED ORDER — EPHEDRINE 5 MG/ML INJ
INTRAVENOUS | Status: AC
Start: 2021-09-21 — End: ?
  Filled 2021-09-21: qty 5

## 2021-09-21 MED ORDER — ONDANSETRON HCL 4 MG/2ML IJ SOLN
INTRAMUSCULAR | Status: DC | PRN
  Administered 2021-09-21: 4 mg via INTRAVENOUS

## 2021-09-21 MED ORDER — KETOROLAC TROMETHAMINE 30 MG/ML IJ SOLN
INTRAMUSCULAR | Status: AC
  Filled 2021-09-21: qty 1

## 2021-09-21 MED ORDER — METOCLOPRAMIDE HCL 5 MG PO TABS: 5.0000 mg | ORAL_TABLET | Freq: Three times a day (TID) | ORAL | Status: DC | PRN

## 2021-09-21 MED ORDER — FENTANYL CITRATE (PF) 100 MCG/2ML IJ SOLN
INTRAMUSCULAR | Status: DC | PRN
  Administered 2021-09-21 (×2): 25 ug via INTRAVENOUS
  Administered 2021-09-21: 50 ug via INTRAVENOUS

## 2021-09-21 MED ORDER — VENLAFAXINE HCL ER 37.5 MG PO CP24
37.5000 mg | ORAL_CAPSULE | Freq: Every day | ORAL | Status: DC
  Administered 2021-09-22 – 2021-09-23 (×2): 37.5 mg via ORAL
  Filled 2021-09-21 (×2): qty 1

## 2021-09-21 MED ORDER — ONDANSETRON HCL 4 MG/2ML IJ SOLN
INTRAMUSCULAR | Status: AC
Start: 2021-09-21 — End: ?
  Filled 2021-09-21: qty 2

## 2021-09-21 SURGICAL SUPPLY — 47 items
APL PRP STRL LF DISP 70% ISPRP (MISCELLANEOUS) ×1
BNDG COHESIVE 4X5 TAN ST LF (GAUZE/BANDAGES/DRESSINGS) ×3 IMPLANT
BNDG ESMARK 6X12 TAN STRL LF (GAUZE/BANDAGES/DRESSINGS) ×3 IMPLANT
CHLORAPREP W/TINT 26 (MISCELLANEOUS) ×3 IMPLANT
COOLER POLAR GLACIER W/PUMP (MISCELLANEOUS) ×3 IMPLANT
CUFF TOURN SGL QUICK 24 (TOURNIQUET CUFF)
CUFF TOURN SGL QUICK 34 (TOURNIQUET CUFF)
CUFF TRNQT CYL 24X4X16.5-23 (TOURNIQUET CUFF) IMPLANT
CUFF TRNQT CYL 34X4.125X (TOURNIQUET CUFF) IMPLANT
DRAPE C-ARM XRAY 36X54 (DRAPES) ×3 IMPLANT
DRAPE FLUOR MINI C-ARM 54X84 (DRAPES) ×1 IMPLANT
ELECT REM PT RETURN 9FT ADLT (ELECTROSURGICAL) ×2
ELECTRODE REM PT RTRN 9FT ADLT (ELECTROSURGICAL) ×2 IMPLANT
GAUZE SPONGE 4X4 12PLY STRL (GAUZE/BANDAGES/DRESSINGS) ×3 IMPLANT
GAUZE XEROFORM 1X8 LF (GAUZE/BANDAGES/DRESSINGS) ×3 IMPLANT
GLOVE BIOGEL PI IND STRL 8.5 (GLOVE) ×2 IMPLANT
GLOVE BIOGEL PI INDICATOR 8.5 (GLOVE) ×1
GLOVE SURG ORTHO 8.5 STRL (GLOVE) ×6 IMPLANT
GOWN STRL REUS W/ TWL LRG LVL3 (GOWN DISPOSABLE) ×4 IMPLANT
GOWN STRL REUS W/TWL LRG LVL3 (GOWN DISPOSABLE) ×4
IMMOB KNEE 14 THIGH 24 706614 (SOFTGOODS) ×3 IMPLANT
KIT TURNOVER KIT A (KITS) ×3 IMPLANT
MANIFOLD NEPTUNE II (INSTRUMENTS) ×3 IMPLANT
NDL REVERSE CUT 1/2 CRC (NEEDLE) IMPLANT
NDL SAFETY ECLIPSE 18X1.5 (NEEDLE) ×2 IMPLANT
NEEDLE HYPO 18GX1.5 SHARP (NEEDLE) ×2
NEEDLE REVERSE CUT 1/2 CRC (NEEDLE) ×2 IMPLANT
NS IRRIG 1000ML POUR BTL (IV SOLUTION) ×3 IMPLANT
PACK EXTREMITY ARMC (MISCELLANEOUS) ×3 IMPLANT
PAD CAST CTTN 4X4 STRL (SOFTGOODS) ×2 IMPLANT
PAD WRAPON POLAR KNEE (MISCELLANEOUS) ×2 IMPLANT
PADDING CAST COTTON 4X4 STRL (SOFTGOODS) ×2
RETRIEVER SUT HEWSON (MISCELLANEOUS) ×1 IMPLANT
SOLUTION IRRIG SURGIPHOR (IV SOLUTION) ×1 IMPLANT
SPLINT CAST 1 STEP 5X30 WHT (MISCELLANEOUS) ×2 IMPLANT
SPONGE T-LAP 18X18 ~~LOC~~+RFID (SPONGE) ×3 IMPLANT
STAPLER SKIN PROX 35W (STAPLE) ×3 IMPLANT
STOCKINETTE BIAS CUT 6 980064 (GAUZE/BANDAGES/DRESSINGS) ×3 IMPLANT
STOCKINETTE IMPERVIOUS 9X36 MD (GAUZE/BANDAGES/DRESSINGS) ×3 IMPLANT
SUT DVC 2 QUILL PDO  T11 36X36 (SUTURE) ×2
SUT DVC 2 QUILL PDO T11 36X36 (SUTURE) IMPLANT
SUT ETHIBOND #5 BRAIDED 30INL (SUTURE) ×1 IMPLANT
SUT VIC AB PLUS 45CM 1-MO-4 (SUTURE) ×1 IMPLANT
SYR 10ML LL (SYRINGE) ×3 IMPLANT
SYR 30ML LL (SYRINGE) ×1 IMPLANT
WATER STERILE IRR 500ML POUR (IV SOLUTION) ×3 IMPLANT
WRAPON POLAR PAD KNEE (MISCELLANEOUS) ×2

## 2021-09-21 NOTE — Op Note (Signed)
PATIENT:  Mindy Caldwell     PRE-OPERATIVE DIAGNOSIS:  S82.001A Unsp fracture of right patella, init for clos fx, history of total knee arthroplasty   POST-OPERATIVE DIAGNOSIS:  Same   PROCEDURE:  REVISION OPEN REDUCTION INTERNAL (ORIF) FIXATION PATELLA, RIGHT   SURGEON:  Lyndle Herrlich, MD   ASSIST: Altamese Cabal, PA-C   TOURNIQUET TIME:  37  MIN at 250 mmHg   ANESTHESIA:   General   PREOPERATIVE INDICATIONS:  Mindy Caldwell is a  66 y.o. female with a diagnosis of S82.001A Unsp fracture of right patella, init for clos fx who failed conservative measures and elected for surgical management.  She fell at home after her surgery last week and presented to the office with pain and disruption of the prior fracture repair.   The risks benefits and alternatives were discussed with the patient preoperatively including but not limited to the risks of infection, bleeding, nerve injury, malunion, nonunion, wrist stiffness, persistent wrist pain, osteoarthritis and the need for further surgery. Medical risks include but are not limited to DVT and pulmonary embolism, myocardial infarction, stroke, pneumonia, respiratory failure and death. Patient  understood these risks and wished to proceed.      OPERATIVE PROCEDURE:    Patient brought to the operating room. Patient was placed supine on the operative table. General anesthesia was induced.  Patient had a tourniquet applied to the right upper thigh and prepped and draped in a sterile fashion. A timeout was performed to verify the patient's name, date of birth, medical record number, correct site of surgery correct procedure to be performed. Was also used to verify the patient received antibiotics appropriate instruments, implants and radiographic studies were available in the room. Once all in attendance were in agreement case began. The staples were removed prior to sterile preparation of the knee.   The prior vertical incision was used over the patella  using the prior total knee incision. Full-thickness skin flaps were developed. The fracture was then identified. The retinaculum however was not torn. Disruption of the fracture repair was noted. All previous suture material was removed and passed from the field. The patellar button was identified and found to be well fixed. The scar tissue was debrided sharply with rongeurs. The fracture was irrigated. The femoral component appeared to be in good condition. The joint was irrigated with betadine impregnated saline. Ethibond #5 was placed along the infrapatellar ligament in a locking running fashion. A total of 4 sutures were placed. The three prior drill holes were placed through the superior fragment of the patella. Using a suture passer the 4 limbs were brought through. The sutures were tied along the superior aspect over the bone.    The retinaculum medially was repaired with #1 Vicryl. The figure-of-eight construct was covered with the soft tissue envelope closed with 1 Vicryl. Subcutaneous tissue was closed with an interrupted 2-0 Vicryl and the skin was approximated with staples. The patient was placed in a long posterior splint. This will be changed to a cylinder case later this week. All sharp and instrument counts were correct at the conclusion the case. I was scrubbed and present the entire case. I spoke with the patient's family in the postop consultation room at the conclusion the case with them know the patient was stable in the recovery room in the case had been performed without complication.   Cassell Smiles, MD

## 2021-09-21 NOTE — Anesthesia Preprocedure Evaluation (Signed)
Anesthesia Evaluation  Patient identified by MRN, date of birth, ID band Patient awake    Reviewed: Allergy & Precautions, H&P , NPO status , Patient's Chart, lab work & pertinent test results, reviewed documented beta blocker date and time   Airway Mallampati: II  TM Distance: >3 FB Neck ROM: full    Dental  (+) Teeth Intact   Pulmonary neg pulmonary ROS, pneumonia, resolved,    Pulmonary exam normal        Cardiovascular Exercise Tolerance: Good hypertension, negative cardio ROS Normal cardiovascular exam Rate:Normal     Neuro/Psych  Headaches, PSYCHIATRIC DISORDERS Anxiety Depression Bipolar Disorder negative neurological ROS  negative psych ROS   GI/Hepatic negative GI ROS, Neg liver ROS,   Endo/Other  negative endocrine ROS  Renal/GU negative Renal ROS  negative genitourinary   Musculoskeletal   Abdominal   Peds  Hematology negative hematology ROS (+)   Anesthesia Other Findings   Reproductive/Obstetrics negative OB ROS                             Anesthesia Physical Anesthesia Plan  ASA: 2  Anesthesia Plan: General LMA   Post-op Pain Management:    Induction:   PONV Risk Score and Plan:   Airway Management Planned:   Additional Equipment:   Intra-op Plan:   Post-operative Plan:   Informed Consent: I have reviewed the patients History and Physical, chart, labs and discussed the procedure including the risks, benefits and alternatives for the proposed anesthesia with the patient or authorized representative who has indicated his/her understanding and acceptance.       Plan Discussed with: CRNA  Anesthesia Plan Comments:         Anesthesia Quick Evaluation

## 2021-09-22 ENCOUNTER — Encounter: Payer: Self-pay | Admitting: Orthopedic Surgery

## 2021-09-22 DIAGNOSIS — S82031A Displaced transverse fracture of right patella, initial encounter for closed fracture: Secondary | ICD-10-CM | POA: Diagnosis not present

## 2021-09-22 MED ORDER — ORAL CARE MOUTH RINSE: 15.0000 mL | OROMUCOSAL | Status: DC | PRN

## 2021-09-22 NOTE — Progress Notes (Signed)
Met with the patient and her daughter sin the room to discuss dc plan and needs She will have a wheelchair with elevated leg rests and a 3 in 1 delivered to the room by [employer] She wants to wait until she can bear weight to get [facility] services She lives at home with her boyfriend and he will be assisting her Her daughter will provide transportation

## 2021-09-22 NOTE — Evaluation (Signed)
Physical Therapy Evaluation Patient Details Name: Mindy Caldwell MRN: [id number] DOB: 01/16/1956 Today's Date: 09/22/2021  History of Present Illness  Pt is a 66 yo female s/p ORIF of R patella on 6/21, fell at home after surgery, and is now s/p R patellar ORIF revision on 6/26. PMH HTN, anxiety, depression, bipolar, PTSD.   Clinical Impression  Patient alert, agreeable to PT sitting in recliner at start of session. Reported 7/10 R knee pain with mobility. Pt stated prior to knee surgeries, she was independent. Per also said her boyfriend is available to help at discharge and was helping as needed between surgeries.   The patient was educated on NWB. Sit <> stand with RW and CGA, PT foot on pt foot to confirm NWB, good adherence. She was able to hop forward/backwards 70ft, CGA. Some assistance for RW management when hopping backwards. Pt declined any further hopping at this time due to pain.  Overall the patient demonstrated deficits (see "PT Problem List") that impede the patient's functional abilities, safety, and mobility and would benefit from skilled PT intervention. Recommendation at this time is HHPT with intermittent supervision/assistance to maximize function, independence, and safety.    Patient suffers from R patellar ORIF revision and her weight bearing status is non weight bearing which impairs his/her ability to perform daily activities like toileting, feeding, dressing, grooming, bathing in the home. A cane, walker, crutch will not resolve the patient's issue with performing activities of daily living. A lightweight wheelchair and cushion is required/recommended and will allow patient to safely perform daily activities.   Patient can safely propel the wheelchair in the home or has a caregiver who can provide assistance.         Recommendations for follow up therapy are one component of a multi-disciplinary discharge planning process, led by the attending physician.  Recommendations  may be updated based on patient status, additional functional criteria and insurance authorization.  Follow Up Recommendations Home health PT      Assistance Recommended at Discharge Intermittent Supervision/Assistance  Patient can return home with the following  A little help with walking and/or transfers;A little help with bathing/dressing/bathroom;Assistance with cooking/housework;Assist for transportation;Help with stairs or ramp for entrance;Direct supervision/assist for medications management    Equipment Recommendations Wheelchair (measurements PT);Wheelchair cushion (measurements PT);BSC/3in1  Recommendations for Other Services       Functional Status Assessment Patient has had a recent decline in their functional status and demonstrates the ability to make significant improvements in function in a reasonable and predictable amount of time.     Precautions / Restrictions Precautions Precautions: Fall Restrictions Weight Bearing Restrictions: Yes RLE Weight Bearing: Non weight bearing      Mobility  Bed Mobility               General bed mobility comments: pt up in recliner at start/end of session    Transfers Overall transfer level: Needs assistance Equipment used: Rolling walker (2 wheels) Transfers: Sit to/from Stand Sit to Stand: Min guard           General transfer comment: cued for NWB status    Ambulation/Gait Ambulation/Gait assistance: Min guard Gait Distance (Feet): 4 Feet Assistive device: Rolling walker (2 wheels)         General Gait Details: some assistance with managing RW going backwards, but able to maintain NWB well. painful for pt  Stairs            Wheelchair Mobility    Modified Rankin (Stroke Patients  Only)       Balance Overall balance assessment: Needs assistance Sitting-balance support: Feet supported Sitting balance-Leahy Scale: Good     Standing balance support: Reliant on assistive device for  balance Standing balance-Leahy Scale: Poor                               Pertinent Vitals/Pain Pain Assessment Pain Assessment: 0-10 Pain Score: 7  Pain Location: R knee Pain Descriptors / Indicators: Grimacing, Moaning, Aching, Sore Pain Intervention(s): Limited activity within patient's tolerance, Monitored during session, Repositioned, Patient requesting pain meds-RN notified    Home Living Family/patient expects to be discharged to:: Private residence Living Arrangements:  (boyfriend) Available Help at Discharge: Friend(s);Available 24 hours/day Type of Home: Mobile home Home Access: Other (comment) (small threshold)       Home Layout: One level Home Equipment: Agricultural consultant (2 wheels);Standard Walker      Prior Function Prior Level of Function : Independent/Modified Independent             Mobility Comments: prior to previous ORIF, pt independent       Hand Dominance        Extremity/Trunk Assessment   Upper Extremity Assessment Upper Extremity Assessment: Overall WFL for tasks assessed    Lower Extremity Assessment Lower Extremity Assessment:  (Patient able to lift both extremities against gravity)    Cervical / Trunk Assessment Cervical / Trunk Assessment: Normal  Communication   Communication: No difficulties  Cognition Arousal/Alertness: Awake/alert Behavior During Therapy: WFL for tasks assessed/performed Overall Cognitive Status: Within Functional Limits for tasks assessed                                          General Comments      Exercises     Assessment/Plan    PT Assessment Patient needs continued PT services  PT Problem List Decreased strength;Decreased mobility;Decreased activity tolerance;Decreased balance;Decreased knowledge of use of DME;Pain;Decreased knowledge of precautions;Decreased safety awareness       PT Treatment Interventions DME instruction;Therapeutic exercise;Gait  training;Balance training;Stair training;Neuromuscular re-education;Functional mobility training;Therapeutic activities;Patient/family education    PT Goals (Current goals can be found in the Care Plan section)  Acute Rehab PT Goals Patient Stated Goal: to go home PT Goal Formulation: With patient Time For Goal Achievement: 10/06/21 Potential to Achieve Goals: Good    Frequency BID     Co-evaluation               AM-PAC PT "6 Clicks" Mobility  Outcome Measure Help needed turning from your back to your side while in a flat bed without using bedrails?: None Help needed moving from lying on your back to sitting on the side of a flat bed without using bedrails?: None Help needed moving to and from a bed to a chair (including a wheelchair)?: None Help needed standing up from a chair using your arms (e.g., wheelchair or bedside chair)?: None Help needed to walk in hospital room?: A Little Help needed climbing 3-5 steps with a railing? : A Lot 6 Click Score: 21    End of Session Equipment Utilized During Treatment: Gait belt Activity Tolerance: Patient tolerated treatment well;Patient limited by pain Patient left: in chair;with call bell/phone within reach;with family/visitor present;with SCD's reapplied Nurse Communication: Mobility status PT Visit Diagnosis: Other abnormalities of gait and mobility (R26.89);Difficulty  in walking, not elsewhere classified (R26.2);Muscle weakness (generalized) (M62.81);Pain Pain - Right/Left: Right Pain - part of body: Knee    Time: 0981-1914 PT Time Calculation (min) (ACUTE ONLY): 22 min   Charges:   PT Evaluation $PT Eval Low Complexity: 1 Low PT Treatments $Therapeutic Activity: 8-22 mins        Olga Coaster PT, DPT 10:19 AM,09/22/21

## 2021-09-23 DIAGNOSIS — S82031A Displaced transverse fracture of right patella, initial encounter for closed fracture: Secondary | ICD-10-CM | POA: Diagnosis not present

## 2021-09-23 MED ORDER — HYDROCODONE-ACETAMINOPHEN 5-325 MG PO TABS
1.0000 | ORAL_TABLET | ORAL | 0 refills | Status: AC | PRN
Start: 2021-09-23 — End: ?

## 2021-09-23 MED ORDER — ASPIRIN 81 MG PO TBEC: 81.0000 mg | DELAYED_RELEASE_TABLET | Freq: Every day | ORAL | 12 refills | Status: AC

## 2021-09-23 MED ORDER — DOCUSATE SODIUM 100 MG PO CAPS: 100.0000 mg | ORAL_CAPSULE | Freq: Two times a day (BID) | ORAL | 0 refills | Status: AC

## 2021-09-23 NOTE — Progress Notes (Signed)
Physical Therapy Treatment Patient Details Name: Mindy Caldwell MRN: [id number] DOB: 1955-06-12 Today's Date: 09/23/2021   History of Present Illness Pt is a 66 yo female s/p ORIF of R patella on 6/21, fell at home after surgery, and is now s/p R patellar ORIF revision on 6/26. PMH HTN, anxiety, depression, bipolar, PTSD.    PT Comments    Patient alert, agreeable to PT but did become VERY anxious. She was able to sit <> Stand with CGA and RW, (successful on second attempt), as well as stand pivot to [facility]. She was able to hop ~64f, but became very anxious (pt references she has panic attacks). Pt previously received medication from RN, and talked through calming techniques as well as education. No LOB noted but RR very elevated when hopping. Returned to chair with all needs in reach and questions answered. The patient would benefit from further skilled PT intervention to continue to progress towards goals. Recommendation remains appropriate.     Recommendations for follow up therapy are one component of a multi-disciplinary discharge planning process, led by the attending physician.  Recommendations may be updated based on patient status, additional functional criteria and insurance authorization.  Follow Up Recommendations  Home health PT     Assistance Recommended at Discharge Intermittent Supervision/Assistance  Patient can return home with the following A little help with walking and/or transfers;A little help with bathing/dressing/bathroom;Assistance with cooking/housework;Assist for transportation;Help with stairs or ramp for entrance;Direct supervision/assist for medications management   Equipment Recommendations  Wheelchair (measurements PT);Wheelchair cushion (measurements PT);BSC/3in1    Recommendations for Other Services       Precautions / Restrictions Precautions Precautions: Fall Required Braces or Orthoses: Splint/Cast Restrictions Weight Bearing Restrictions: Yes RLE  Weight Bearing: Non weight bearing     Mobility  Bed Mobility               General bed mobility comments: pt up in recliner at start/end of session    Transfers Overall transfer level: Needs assistance Equipment used: Rolling walker (2 wheels) Transfers: Sit to/from Stand Sit to Stand: Min guard           General transfer comment: did not completely come up into standing first attempt, with second attempt successfully upright    Ambulation/Gait   Gait Distance (Feet): 8 Feet Assistive device: Rolling walker (2 wheels)   Gait velocity: decreased     General Gait Details: slow steady hops using RW w/ no LOB; able to maintain NWB. pt became incredibly anxious   Stairs             Wheelchair Mobility    Modified Rankin (Stroke Patients Only)       Balance Overall balance assessment: Needs assistance Sitting-balance support: Feet supported Sitting balance-Leahy Scale: Good     Standing balance support: Reliant on assistive device for balance, During functional activity Standing balance-Leahy Scale: Poor                              Cognition Arousal/Alertness: Awake/alert Behavior During Therapy: WFL for tasks assessed/performed Overall Cognitive Status: Within Functional Limits for tasks assessed                                          Exercises Other Exercises Other Exercises: [facility]  General Comments General comments (skin integrity, edema, etc.): vss throughout      Pertinent Vitals/Pain Pain Assessment Pain Score: 7  Pain Location: R knee Pain Descriptors / Indicators: Grimacing, Guarding, Aching Pain Intervention(s): Limited activity within patient's tolerance, Monitored during session, Repositioned    Home Living                          Prior Function            PT Goals (current goals can now be found in the care plan section) Progress  towards PT goals: Progressing toward goals    Frequency    BID      PT Plan Current plan remains appropriate    Co-evaluation              AM-PAC PT "6 Clicks" Mobility   Outcome Measure  Help needed turning from your back to your side while in a flat bed without using bedrails?: None Help needed moving from lying on your back to sitting on the side of a flat bed without using bedrails?: None Help needed moving to and from a bed to a chair (including a wheelchair)?: None Help needed standing up from a chair using your arms (e.g., wheelchair or bedside chair)?: None Help needed to walk in hospital room?: A Little Help needed climbing 3-5 steps with a railing? : A Lot 6 Click Score: 21    End of Session Equipment Utilized During Treatment: Gait belt Activity Tolerance: Patient tolerated treatment well Patient left: in chair;with call bell/phone within reach Nurse Communication: Mobility status PT Visit Diagnosis: Other abnormalities of gait and mobility (R26.89);Difficulty in walking, not elsewhere classified (R26.2);Muscle weakness (generalized) (M62.81);Pain Pain - Right/Left: Right Pain - part of body: Knee     Time: 1000-1018 PT Time Calculation (min) (ACUTE ONLY): 18 min  Charges:  $Therapeutic Activity: 8-22 mins                     Lieutenant Diego PT, DPT 11:17 AM,09/23/21

## 2021-09-23 NOTE — Progress Notes (Signed)
  Subjective:  Patient reports pain as mild to moderate.  Having trouble with transfers because NWB RLE.  Objective:   VITALS:   Vitals:   09/22/21 1948 09/23/21 0614 09/23/21 0741 09/23/21 0939  BP: (!) 154/91 114/74 122/85   Pulse: 88 86 91 78  Resp: _0 Temp: 98.8 F (37.1 C) 98.7 F (37.1 C) 98.3 F (36.8 C)   TempSrc:  Oral    SpO2: 99% 96% 95% 98%  Weight:      Height:        PHYSICAL EXAM:  Neurologically intact ABD soft Neurovascular intact Sensation intact distally Intact pulses distally Dorsiflexion/Plantar flexion intact Incision: dressing C/D/I No cellulitis present Compartment soft  LABS  No results found for this or any previous visit (from the past 24 hour(s)).  DG MINI C-ARM IMAGE ONLY  Result Date: 09/21/2021 There is no interpretation for this exam.  This order is for images obtained during a surgical procedure.  Please See "Surgeries" Tab for more information regarding the procedure.    Assessment/Plan: 2 Days Post-Op   Principal Problem:   Right patella fracture   Advance diet Up with therapy May Discharge home when PT goals met, Still NWB RLE Already has an appointment in the office on 09/25/21   Carlynn Spry , PA-C 09/23/2021, 9:40 AM

## 2021-09-23 NOTE — TOC Progression Note (Addendum)
Transition of Care [facility]) - Progression Note    Patient Details  Name: TALEEYA BLONDIN MRN: [id number] Date of Birth: [birth year]  [facility] CM/SW [place], RN Phone Number: 09/23/2021, 10:28 AM  Clinical Narrative:    Patient would like a few [place] PT visits, looking for [facility] services for the patient, [facility] and [facility] is not able to accept the patient, [facility] is checking to see if they can  Expected Discharge Plan: Home/Self Care Barriers to Discharge: Barriers Resolved  Expected Discharge Plan and Services Expected Discharge Plan: Home/Self Care   Discharge Planning Services: CM Consult   Living arrangements for the past 2 months: Single Family Home Expected Discharge Date: 09/23/21               DME Arranged: Wheelchair manual, 3-N-1 DME Agency: [employer] Date DME Agency Contacted: 09/22/21 Time DME Agency Contacted: [phone] Representative spoke with at DME Agency: Suanne Marker HH Arranged: NA (wilol wait until can bear weight)           Social Determinants of Health (SDOH) Interventions    Readmission Risk Interventions     No data to display

## 2021-09-23 NOTE — Care Management Obs Status (Signed)
[place] NOTIFICATION   Patient Details  Name: Mindy Caldwell MRN: [id number] Date of Birth: 1955-08-11   Medicare Observation Status Notification Given:  Yes    Conception Oms, RN 09/23/2021, 8:51 AM

## 2021-09-23 NOTE — Discharge Summary (Signed)
Physician Discharge Summary  Patient ID: TUERE NWOSU MRN: [id number] DOB/AGE: 68-Jun-1957 68 y.o.  Admit date: 09/21/2021 Discharge date: 09/23/2021  Admission Diagnoses:  right patella fracture Right patella fracture  Discharge Diagnoses:  right patella fracture Principal Problem:   Right patella fracture   Past Medical History:  Diagnosis Date   Agoraphobia    Arthritis    Bipolar 1 disorder ([place])    Cancer ([place])    skin cancer   Depression    Injury of lung    scarring of left lung due to pneumonia   Panic attacks    Pneumonia    scarring to left lung due to repeat pneumonia in her younger years   PTSD (post-traumatic stress disorder)     Surgeries: Procedure(s): OPEN REDUCTION INTERNAL (ORIF) FIXATION PATELLA on 09/21/2021   Consultants (if any):   Discharged Condition: Improved  Hospital Course: MARIADEJESUS CADE is an 66 y.o. female who was admitted 09/21/2021 with a diagnosis of  right patella fracture Right patella fracture and went to the operating room on 09/21/2021 and underwent the above named procedures.    She was given perioperative antibiotics:  Anti-infectives (From admission, onward)    Start     Dose/Rate Route Frequency Ordered Stop   09/21/21 2100  ceFAZolin (ANCEF) IVPB 2g/100 mL premix        2 g 200 mL/hr over 30 Minutes Intravenous Every 8 hours 09/21/21 1732 09/22/21 0454   09/21/21 1438  ceFAZolin (ANCEF) 2-4 GM/100ML-% IVPB       Note to Pharmacy: Sylvester Harder P: cabinet override      09/21/21 1438 09/21/21 1500   09/21/21 0600  ceFAZolin (ANCEF) IVPB 2g/100 mL premix        2 g 200 mL/hr over 30 Minutes Intravenous On call to O.R. 09/20/21 2201 09/21/21 1513     .  She was given sequential compression devices, early ambulation, and aspirin 81 mg daily for 30 days for DVT prophylaxis.  She benefited maximally from the hospital stay and there were no complications.    Recent vital signs:  Vitals:   09/23/21 0741 09/23/21 0939  BP:  122/85   Pulse: 91 78  Resp: 20   Temp: 98.3 F (36.8 C)   SpO2: 95% 98%    Recent laboratory studies:  Lab Results  Component Value Date   HGB 14.2 09/10/2021   HGB 15.1 07/19/2016   HGB 13.5 10/21/2014   Lab Results  Component Value Date   WBC 9.6 09/10/2021   PLT 277 09/10/2021   Lab Results  Component Value Date   INR 0.95 07/19/2016   Lab Results  Component Value Date   NA 139 09/10/2021   K 3.7 09/10/2021   CL 103 09/10/2021   CO2 28 09/10/2021   BUN 14 09/10/2021   CREATININE 1.06 (H) 09/10/2021   GLUCOSE 93 09/10/2021    Discharge Medications:   Allergies as of 09/23/2021       Reactions   Mobic [meloxicam] Nausea Only   Oxybutynin Chloride Other (See Comments)   Made symptoms worse   Prednisone Nausea And Vomiting        Medication List     TAKE these medications    ALPRAZolam 1 MG tablet Commonly known as: XANAX Take 1 mg by mouth 2 (two) times daily.   aspirin EC 81 MG tablet Take 1 tablet (81 mg total) by mouth daily. Swallow whole.   docusate sodium 100 MG capsule Commonly known  as: Colace Take 1 capsule (100 mg total) by mouth daily as needed. What changed: Another medication with the same name was added. Make sure you understand how and when to take each.   docusate sodium 100 MG capsule Commonly known as: COLACE Take 1 capsule (100 mg total) by mouth 2 (two) times daily. What changed: You were already taking a medication with the same name, and this prescription was added. Make sure you understand how and when to take each.   HYDROcodone-acetaminophen 5-325 MG tablet Commonly known as: NORCO/VICODIN Take 1 tablet by mouth every 4 (four) hours as needed for moderate pain (pain score 4-6). What changed: reasons to take this   ketoconazole 2 % shampoo Commonly known as: NIZORAL Apply 1 application  topically as needed for irritation.   nystatin cream Commonly known as: MYCOSTATIN Apply 1 application  topically as needed for  dry skin. Under breasts   tiZANidine 4 MG tablet Commonly known as: ZANAFLEX Take 4 mg by mouth as needed.   venlafaxine 37.5 MG tablet Commonly known as: EFFEXOR Take 37.5 mg by mouth every morning.   venlafaxine XR 37.5 MG 24 hr capsule Commonly known as: EFFEXOR-XR Take 37.5 mg by mouth daily with breakfast.   ziprasidone 80 MG capsule Commonly known as: GEODON Take 160 mg by mouth every evening.               Durable Medical Equipment  (From admission, onward)           Start     Ordered   09/22/21 1119  For home use only DME standard manual wheelchair with seat cushion  Once       Comments: Patient suffers from non weight bearing which impairs their ability to perform daily activities like adls in the home.  A walking aide  will not resolve issue with performing activities of daily living. A wheelchair will allow patient to safely perform daily activities. Patient can safely propel the wheelchair in the home or has a caregiver who can provide assistance. Length of need 6 months. Accessories: elevating leg rests (ELRs) articulating , wheel locks, extensions and anti-tippers. Back cushion   09/22/21 1119            Diagnostic Studies: DG MINI C-ARM IMAGE ONLY  Result Date: 09/21/2021 There is no interpretation for this exam.  This order is for images obtained during a surgical procedure.  Please See "Surgeries" Tab for more information regarding the procedure.   DG C-Arm 1-60 Min-No Report  Result Date: 09/16/2021 CLINICAL DATA:  ORIF patellar fracture EXAM: RIGHT KNEE - 1-2 VIEW; DG C-ARM 1-60 MIN-NO REPORT COMPARISON:  None Available. FLUOROSCOPY: Air kerma 0.45 mGy FINDINGS: Intraoperative lateral views of the right knee demonstrate transverse fracture of the patella with clamping instrument. IMPRESSION: Intraoperative lateral views of the right knee demonstrate transverse fracture of the patella with clamping instrument. Electronically Signed   By: Delanna Ahmadi M.D.   On: 09/16/2021 13:21   DG Knee 1-2 Views Right  Result Date: 09/16/2021 CLINICAL DATA:  ORIF patellar fracture EXAM: RIGHT KNEE - 1-2 VIEW; DG C-ARM 1-60 MIN-NO REPORT COMPARISON:  None Available. FLUOROSCOPY: Air kerma 0.45 mGy FINDINGS: Intraoperative lateral views of the right knee demonstrate transverse fracture of the patella with clamping instrument. IMPRESSION: Intraoperative lateral views of the right knee demonstrate transverse fracture of the patella with clamping instrument. Electronically Signed   By: Delanna Ahmadi M.D.   On: 09/16/2021 13:21   [place] OR  NERVE BLOCK-IMAGE ONLY [facility])  Result Date: 09/16/2021 There is no interpretation for this exam.  This order is for images obtained during a surgical procedure.  Please See "Surgeries" Tab for more information regarding the procedure.    Disposition: Discharge disposition: 01-Home or Self Care            Signed: Carlynn Spry ,PA-C 09/23/2021, 9:52 AM

## 2021-09-23 NOTE — Discharge Instructions (Signed)
NWB RLE     Elevate the right lower extremity whenever possible   Take aspirin 81 once daily for 30 days as for blood clot prevention.  Call [phone] with any questions, such as fever > 101.5 degrees, drainage from the wound or shortness of breath.   Follow up set for 09/25/21

## 2021-09-23 NOTE — Progress Notes (Signed)
1245 Dc papers reviewed with pt at this time. IV removed. All questions and concerns answered. Pt verbalized understanding. Waiting on daughter for pick up

## 2021-09-23 NOTE — Progress Notes (Signed)
Occupational Therapy Treatment Patient Details Name: Mindy Caldwell MRN: [id number] DOB: 1956-03-03 Today's Date: 09/23/2021   History of present illness Pt is a 66 yo female s/p ORIF of R patella on 6/21, fell at home after surgery, and is now s/p R patellar ORIF revision on 6/26. PMH HTN, anxiety, depression, bipolar, PTSD.   OT comments  Chart reviewed, RN cleared pt for participation in OT tx session. Tx session targeted progressing safe completion of functional mobility in order to facilitate safe ADL completion. Progress is noted in functional transfers from BSC>chair with RW with CGA one vc required for technique. Supervision required for peri care following BM. UB bathing completed with SET UP, LB bathing with CGA for STS with RW. Education provided re: use of bsc, mwc use for safe ADL completion. Pt is left in bedside chair, NT present, NAD, all needs met. OT will follow acutely.    Recommendations for follow up therapy are one component of a multi-disciplinary discharge planning process, led by the attending physician.  Recommendations may be updated based on patient status, additional functional criteria and insurance authorization.    Follow Up Recommendations  Home health OT    Assistance Recommended at Discharge Intermittent Supervision/Assistance  Patient can return home with the following  A little help with walking and/or transfers;Assistance with cooking/housework;Assist for transportation;Help with stairs or ramp for entrance;A little help with bathing/dressing/bathroom   Equipment Recommendations  BSC/3in1;Wheelchair (measurements OT);Wheelchair cushion (measurements OT);Other (comment)    Recommendations for Other Services      Precautions / Restrictions Precautions Precautions: Fall Required Braces or Orthoses: Splint/Cast Restrictions Weight Bearing Restrictions: Yes RLE Weight Bearing: Non weight bearing       Mobility Bed Mobility                General bed mobility comments: NT pt on BSC at start of session, in recliner post session    Transfers Overall transfer level: Needs assistance Equipment used: Rolling walker (2 wheels) Transfers: Sit to/from Stand Sit to Stand: Min guard     Step pivot transfers: Min guard     General transfer comment: good adherence to NWBing, one vc for technique     Balance Overall balance assessment: Needs assistance Sitting-balance support: Feet supported Sitting balance-Leahy Scale: Good     Standing balance support: Reliant on assistive device for balance, During functional activity Standing balance-Leahy Scale: Poor                             ADL either performed or assessed with clinical judgement   ADL Overall ADL's : Needs assistance/impaired                                       General ADL Comments: supervision for peri care (sit to stand) with RW, UB bathing with SET UP, LB bathing with CGA with RW (sit to stand), UB dressing with SET UP    Extremity/Trunk Assessment              Vision       Perception     Praxis      Cognition Arousal/Alertness: Awake/alert Behavior During Therapy: WFL for tasks assessed/performed Overall Cognitive Status: Within Functional Limits for tasks assessed  Exercises Other Exercises Other Exercises: edu re: use of mwc, BSC fit/use    Shoulder Instructions       General Comments vss throughout    Pertinent Vitals/ Pain       Pain Assessment Pain Assessment: 0-10 Pain Score: 7  Pain Location: R knee Pain Descriptors / Indicators: Grimacing, Guarding Pain Intervention(s): Limited activity within patient's tolerance, Monitored during session, Repositioned  Home Living                                          Prior Functioning/Environment              Frequency  Min 2X/week        Progress Toward  Goals  OT Goals(current goals can now be found in the care plan section)  Progress towards OT goals: Progressing toward goals     Plan Discharge plan remains appropriate    Co-evaluation                 AM-PAC OT "6 Clicks" Daily Activity     Outcome Measure   Help from another person eating meals?: None Help from another person taking care of personal grooming?: None Help from another person toileting, which includes using toliet, bedpan, or urinal?: A Little Help from another person bathing (including washing, rinsing, drying)?: A Little Help from another person to put on and taking off regular upper body clothing?: None Help from another person to put on and taking off regular lower body clothing?: A Little 6 Click Score: 21    End of Session Equipment Utilized During Treatment: Rolling walker (2 wheels)  OT Visit Diagnosis: Other abnormalities of gait and mobility (R26.89);Pain   Activity Tolerance Patient tolerated treatment well   Patient Left in chair;with call bell/phone within reach;Other (comment) (NT in room)   Nurse Communication Mobility status        Time: 8377-9396 OT Time Calculation (min): 15 min  Charges: OT General Charges $OT Visit: 1 Visit OT Treatments $Self Care/Home Management : 8-22 mins  Shanon Payor, OTD OTR/L  09/23/21, 9:45 AM

## 2021-09-23 NOTE — Care Management CC44 (Signed)
Condition Code 44 Documentation Completed  Patient Details  Name: Mindy Caldwell MRN: [id number] Date of Birth: 1955/05/01   Condition Code 44 given:  Yes Patient signature on Condition Code 44 notice:  Yes Documentation of 2 MD's agreement:  Yes Code 44 added to claim:  Yes    Conception Oms, RN 09/23/2021, 8:51 AM

## 2021-09-23 NOTE — Plan of Care (Signed)
  Problem: Clinical Measurements: Goal: Ability to maintain clinical measurements within normal limits will improve Outcome: Progressing   Problem: Clinical Measurements: Goal: Will remain free from infection Outcome: Progressing   Problem: Pain Managment: Goal: General experience of comfort will improve Outcome: Progressing
